# Patient Record
Sex: Female | Born: 1965
Health system: Southern US, Community
[De-identification: ages and names within clinical notes are randomized; demographics above are authoritative.]

## PROBLEM LIST (undated history)

## (undated) DIAGNOSIS — N644 Mastodynia: Secondary | ICD-10-CM

## (undated) DIAGNOSIS — C50919 Malignant neoplasm of unspecified site of unspecified female breast: Secondary | ICD-10-CM

## (undated) DIAGNOSIS — R112 Nausea with vomiting, unspecified: Secondary | ICD-10-CM

## (undated) DIAGNOSIS — I10 Essential (primary) hypertension: Secondary | ICD-10-CM

## (undated) DIAGNOSIS — J449 Chronic obstructive pulmonary disease, unspecified: Secondary | ICD-10-CM

## (undated) DIAGNOSIS — F419 Anxiety disorder, unspecified: Secondary | ICD-10-CM

## (undated) DIAGNOSIS — C649 Malignant neoplasm of unspecified kidney, except renal pelvis: Secondary | ICD-10-CM

## (undated) DIAGNOSIS — C801 Malignant (primary) neoplasm, unspecified: Secondary | ICD-10-CM

## (undated) DIAGNOSIS — M1990 Unspecified osteoarthritis, unspecified site: Secondary | ICD-10-CM

## (undated) DIAGNOSIS — N189 Chronic kidney disease, unspecified: Secondary | ICD-10-CM

## (undated) DIAGNOSIS — Z9889 Other specified postprocedural states: Secondary | ICD-10-CM

## (undated) DIAGNOSIS — R569 Unspecified convulsions: Secondary | ICD-10-CM

## (undated) DIAGNOSIS — M779 Enthesopathy, unspecified: Secondary | ICD-10-CM

## (undated) DIAGNOSIS — K219 Gastro-esophageal reflux disease without esophagitis: Secondary | ICD-10-CM

## (undated) HISTORY — DX: Chronic kidney disease, unspecified: N18.9

## (undated) HISTORY — PX: BREAST SURGERY: SHX581

## (undated) HISTORY — DX: Gastro-esophageal reflux disease without esophagitis: K21.9

## (undated) HISTORY — PX: BUNIONECTOMY: SHX129

## (undated) HISTORY — DX: Essential (primary) hypertension: I10

## (undated) HISTORY — PX: NASAL SINUS SURGERY: SHX719

## (undated) HISTORY — PX: ABDOMINAL HYSTERECTOMY: SHX81

## (undated) HISTORY — PX: OOPHORECTOMY: SHX86

## (undated) HISTORY — DX: Malignant neoplasm of unspecified kidney, except renal pelvis: C64.9

## (undated) HISTORY — DX: Malignant (primary) neoplasm, unspecified: C80.1

## (undated) HISTORY — DX: Malignant neoplasm of unspecified site of unspecified female breast: C50.919

## (undated) HISTORY — DX: Chronic obstructive pulmonary disease, unspecified: J44.9

## (undated) HISTORY — DX: Mastodynia: N64.4

## (undated) HISTORY — PX: FOOT SURGERY: SHX648

## (undated) HISTORY — DX: Unspecified osteoarthritis, unspecified site: M19.90

---
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
## 2004-07-01 ENCOUNTER — Emergency Department (HOSPITAL_COMMUNITY): Admission: EM | Admit: 2004-07-01 | Discharge: 2004-07-01 | Payer: Self-pay | Admitting: Emergency Medicine

## 2008-04-20 ENCOUNTER — Ambulatory Visit (HOSPITAL_COMMUNITY): Admission: RE | Admit: 2008-04-20 | Discharge: 2008-04-20 | Payer: Self-pay | Admitting: General Surgery

## 2008-04-20 ENCOUNTER — Encounter: Admission: RE | Admit: 2008-04-20 | Discharge: 2008-04-20 | Payer: Self-pay | Admitting: General Surgery

## 2008-04-20 ENCOUNTER — Encounter (INDEPENDENT_AMBULATORY_CARE_PROVIDER_SITE_OTHER): Payer: Self-pay | Admitting: General Surgery

## 2008-04-24 ENCOUNTER — Emergency Department (HOSPITAL_COMMUNITY): Admission: EM | Admit: 2008-04-24 | Discharge: 2008-04-24 | Payer: Self-pay | Admitting: Emergency Medicine

## 2008-04-25 ENCOUNTER — Ambulatory Visit: Payer: Self-pay | Admitting: Obstetrics and Gynecology

## 2008-04-25 ENCOUNTER — Ambulatory Visit (HOSPITAL_COMMUNITY): Admission: RE | Admit: 2008-04-25 | Discharge: 2008-04-25 | Payer: Self-pay | Admitting: Obstetrics & Gynecology

## 2008-04-26 ENCOUNTER — Ambulatory Visit: Payer: Self-pay | Admitting: Obstetrics and Gynecology

## 2008-04-26 ENCOUNTER — Inpatient Hospital Stay (HOSPITAL_COMMUNITY): Admission: AD | Admit: 2008-04-26 | Discharge: 2008-05-01 | Payer: Self-pay | Admitting: General Surgery

## 2008-05-14 ENCOUNTER — Encounter: Admission: RE | Admit: 2008-05-14 | Discharge: 2008-05-14 | Payer: Self-pay | Admitting: General Surgery

## 2008-05-31 ENCOUNTER — Encounter (INDEPENDENT_AMBULATORY_CARE_PROVIDER_SITE_OTHER): Payer: Self-pay | Admitting: General Surgery

## 2008-05-31 ENCOUNTER — Ambulatory Visit (HOSPITAL_COMMUNITY): Admission: RE | Admit: 2008-05-31 | Discharge: 2008-05-31 | Payer: Self-pay | Admitting: General Surgery

## 2008-06-07 ENCOUNTER — Ambulatory Visit: Payer: Self-pay | Admitting: Hematology & Oncology

## 2008-06-15 LAB — CBC WITH DIFFERENTIAL (CANCER CENTER ONLY)
BASO%: 0.6 % (ref 0.0–2.0)
EOS%: 3.4 % (ref 0.0–7.0)
LYMPH#: 2 10*3/uL (ref 0.9–3.3)
MCHC: 33.9 g/dL (ref 32.0–36.0)
MONO#: 0.5 10*3/uL (ref 0.1–0.9)
NEUT#: 6 10*3/uL (ref 1.5–6.5)
NEUT%: 67.9 % (ref 39.6–80.0)
Platelets: 281 10*3/uL (ref 145–400)
RDW: 11.5 % (ref 10.5–14.6)
WBC: 8.9 10*3/uL (ref 3.9–10.0)

## 2008-06-15 LAB — COMPREHENSIVE METABOLIC PANEL
ALT: 11 U/L (ref 0–35)
AST: 13 U/L (ref 0–37)
Albumin: 3.8 g/dL (ref 3.5–5.2)
Alkaline Phosphatase: 80 U/L (ref 39–117)
BUN: 12 mg/dL (ref 6–23)
Creatinine, Ser: 0.57 mg/dL (ref 0.40–1.20)
Potassium: 3.9 mEq/L (ref 3.5–5.3)

## 2008-07-18 LAB — CBC WITH DIFFERENTIAL (CANCER CENTER ONLY)
BASO%: 0.9 % (ref 0.0–2.0)
EOS%: 1.5 % (ref 0.0–7.0)
HGB: 13.9 g/dL (ref 11.6–15.9)
LYMPH#: 3.7 10*3/uL — ABNORMAL HIGH (ref 0.9–3.3)
LYMPH%: 28.5 % (ref 14.0–48.0)
MCH: 32.8 pg (ref 26.0–34.0)
MONO%: 5.6 % (ref 0.0–13.0)
NEUT#: 8.2 10*3/uL — ABNORMAL HIGH (ref 1.5–6.5)
NEUT%: 63.5 % (ref 39.6–80.0)
RBC: 4.23 10*6/uL (ref 3.70–5.32)

## 2008-07-18 LAB — COMPREHENSIVE METABOLIC PANEL
BUN: 14 mg/dL (ref 6–23)
CO2: 25 mEq/L (ref 19–32)
Glucose, Bld: 164 mg/dL — ABNORMAL HIGH (ref 70–99)
Sodium: 144 mEq/L (ref 135–145)
Total Bilirubin: 0.2 mg/dL — ABNORMAL LOW (ref 0.3–1.2)
Total Protein: 6.5 g/dL (ref 6.0–8.3)

## 2008-07-18 LAB — LUTEINIZING HORMONE: LH: 3.6 m[IU]/mL

## 2008-07-18 LAB — FOLLICLE STIMULATING HORMONE: FSH: 3.1 m[IU]/mL

## 2008-07-30 LAB — ESTRADIOL, ULTRA SENS: Estradiol, Ultra Sensitive: 161

## 2008-08-15 ENCOUNTER — Ambulatory Visit: Payer: Self-pay | Admitting: Hematology & Oncology

## 2008-09-10 LAB — COMPREHENSIVE METABOLIC PANEL
ALT: 9 U/L (ref 0–35)
AST: 10 U/L (ref 0–37)
Alkaline Phosphatase: 66 U/L (ref 39–117)
CO2: 23 mEq/L (ref 19–32)
Creatinine, Ser: 0.6 mg/dL (ref 0.40–1.20)
Sodium: 141 mEq/L (ref 135–145)
Total Bilirubin: 0.3 mg/dL (ref 0.3–1.2)
Total Protein: 6 g/dL (ref 6.0–8.3)

## 2008-09-10 LAB — CBC WITH DIFFERENTIAL (CANCER CENTER ONLY)
BASO#: 0 10*3/uL (ref 0.0–0.2)
BASO%: 0.5 % (ref 0.0–2.0)
EOS%: 3.3 % (ref 0.0–7.0)
HGB: 13.1 g/dL (ref 11.6–15.9)
LYMPH#: 1.3 10*3/uL (ref 0.9–3.3)
MCHC: 33.4 g/dL (ref 32.0–36.0)
NEUT#: 4.7 10*3/uL (ref 1.5–6.5)

## 2008-10-31 ENCOUNTER — Ambulatory Visit: Payer: Self-pay | Admitting: Hematology & Oncology

## 2008-11-01 LAB — COMPREHENSIVE METABOLIC PANEL
ALT: 12 U/L (ref 0–35)
AST: 16 U/L (ref 0–37)
Albumin: 3.6 g/dL (ref 3.5–5.2)
CO2: 23 mEq/L (ref 19–32)
Calcium: 8.6 mg/dL (ref 8.4–10.5)
Chloride: 106 mEq/L (ref 96–112)
Potassium: 3.8 mEq/L (ref 3.5–5.3)
Total Protein: 6.6 g/dL (ref 6.0–8.3)

## 2008-11-01 LAB — CBC WITH DIFFERENTIAL (CANCER CENTER ONLY)
Eosinophils Absolute: 0.2 10*3/uL (ref 0.0–0.5)
MCH: 32.6 pg (ref 26.0–34.0)
MONO%: 5.7 % (ref 0.0–13.0)
NEUT#: 4.3 10*3/uL (ref 1.5–6.5)
Platelets: 240 10*3/uL (ref 145–400)
RBC: 4 10*6/uL (ref 3.70–5.32)
WBC: 6.7 10*3/uL (ref 3.9–10.0)

## 2008-12-19 ENCOUNTER — Ambulatory Visit: Payer: Self-pay | Admitting: Hematology & Oncology

## 2008-12-20 LAB — CBC WITH DIFFERENTIAL (CANCER CENTER ONLY)
EOS%: 4.4 % (ref 0.0–7.0)
MCH: 32.8 pg (ref 26.0–34.0)
MCHC: 33.4 g/dL (ref 32.0–36.0)
MONO%: 7 % (ref 0.0–13.0)
NEUT#: 5.3 10*3/uL (ref 1.5–6.5)
Platelets: 263 10*3/uL (ref 145–400)
RDW: 11 % (ref 10.5–14.6)

## 2008-12-21 LAB — VITAMIN D 25 HYDROXY (VIT D DEFICIENCY, FRACTURES): Vit D, 25-Hydroxy: 25 ng/mL — ABNORMAL LOW (ref 30–89)

## 2008-12-21 LAB — COMPREHENSIVE METABOLIC PANEL
ALT: 11 U/L (ref 0–35)
BUN: 9 mg/dL (ref 6–23)
CO2: 24 mEq/L (ref 19–32)
Calcium: 8.6 mg/dL (ref 8.4–10.5)
Chloride: 104 mEq/L (ref 96–112)
Creatinine, Ser: 0.71 mg/dL (ref 0.40–1.20)
Total Bilirubin: 0.5 mg/dL (ref 0.3–1.2)

## 2009-01-18 ENCOUNTER — Encounter: Admission: RE | Admit: 2009-01-18 | Discharge: 2009-01-18 | Payer: Self-pay | Admitting: Hematology & Oncology

## 2009-02-20 ENCOUNTER — Ambulatory Visit: Payer: Self-pay | Admitting: Hematology & Oncology

## 2009-04-25 ENCOUNTER — Ambulatory Visit: Payer: Self-pay | Admitting: Hematology & Oncology

## 2009-04-26 LAB — CBC WITH DIFFERENTIAL (CANCER CENTER ONLY)
BASO#: 0 10*3/uL (ref 0.0–0.2)
EOS%: 4.2 % (ref 0.0–7.0)
HCT: 40 % (ref 34.8–46.6)
HGB: 13.6 g/dL (ref 11.6–15.9)
MCH: 33.1 pg (ref 26.0–34.0)
MCHC: 33.9 g/dL (ref 32.0–36.0)
MONO%: 6.7 % (ref 0.0–13.0)
NEUT%: 65.8 % (ref 39.6–80.0)

## 2009-04-27 LAB — COMPREHENSIVE METABOLIC PANEL
ALT: 11 U/L (ref 0–35)
AST: 12 U/L (ref 0–37)
CO2: 25 mEq/L (ref 19–32)
Calcium: 9.1 mg/dL (ref 8.4–10.5)
Chloride: 105 mEq/L (ref 96–112)
Creatinine, Ser: 0.69 mg/dL (ref 0.40–1.20)
Sodium: 141 mEq/L (ref 135–145)
Total Bilirubin: 0.6 mg/dL (ref 0.3–1.2)
Total Protein: 6.7 g/dL (ref 6.0–8.3)

## 2009-04-27 LAB — VITAMIN D 25 HYDROXY (VIT D DEFICIENCY, FRACTURES): Vit D, 25-Hydroxy: 43 ng/mL (ref 30–89)

## 2009-06-07 ENCOUNTER — Encounter: Admission: RE | Admit: 2009-06-07 | Discharge: 2009-06-07 | Payer: Self-pay | Admitting: General Surgery

## 2009-07-20 HISTORY — PX: KIDNEY SURGERY: SHX687

## 2009-10-22 ENCOUNTER — Ambulatory Visit: Payer: Self-pay | Admitting: Hematology & Oncology

## 2009-10-24 LAB — CBC WITH DIFFERENTIAL (CANCER CENTER ONLY)
Eosinophils Absolute: 0.5 10*3/uL (ref 0.0–0.5)
HCT: 43.7 % (ref 34.8–46.6)
LYMPH%: 23.7 % (ref 14.0–48.0)
MCV: 98 fL (ref 81–101)
MONO#: 0.7 10*3/uL (ref 0.1–0.9)
NEUT%: 63.8 % (ref 39.6–80.0)
RBC: 4.48 10*6/uL (ref 3.70–5.32)
WBC: 10.1 10*3/uL — ABNORMAL HIGH (ref 3.9–10.0)

## 2009-11-27 ENCOUNTER — Ambulatory Visit: Payer: Self-pay | Admitting: Hematology & Oncology

## 2009-11-27 ENCOUNTER — Ambulatory Visit (HOSPITAL_BASED_OUTPATIENT_CLINIC_OR_DEPARTMENT_OTHER): Admission: RE | Admit: 2009-11-27 | Discharge: 2009-11-27 | Payer: Self-pay | Admitting: Hematology & Oncology

## 2009-11-27 ENCOUNTER — Ambulatory Visit: Payer: Self-pay | Admitting: Diagnostic Radiology

## 2009-12-05 ENCOUNTER — Encounter: Payer: Self-pay | Admitting: Hematology & Oncology

## 2009-12-05 ENCOUNTER — Ambulatory Visit: Payer: Self-pay | Admitting: Vascular Surgery

## 2009-12-05 ENCOUNTER — Ambulatory Visit: Admission: RE | Admit: 2009-12-05 | Discharge: 2009-12-05 | Payer: Self-pay | Admitting: Hematology & Oncology

## 2009-12-11 ENCOUNTER — Encounter: Admission: RE | Admit: 2009-12-11 | Discharge: 2009-12-11 | Payer: Self-pay | Admitting: Hematology & Oncology

## 2010-02-06 ENCOUNTER — Ambulatory Visit: Payer: Self-pay | Admitting: Hematology & Oncology

## 2010-03-12 ENCOUNTER — Ambulatory Visit: Payer: Self-pay | Admitting: Hematology & Oncology

## 2010-03-14 LAB — COMPREHENSIVE METABOLIC PANEL
Alkaline Phosphatase: 63 U/L (ref 39–117)
BUN: 6 mg/dL (ref 6–23)
CO2: 26 mEq/L (ref 19–32)
Chloride: 105 mEq/L (ref 96–112)
Creatinine, Ser: 0.68 mg/dL (ref 0.40–1.20)
Potassium: 3.6 mEq/L (ref 3.5–5.3)
Sodium: 141 mEq/L (ref 135–145)
Total Bilirubin: 0.4 mg/dL (ref 0.3–1.2)

## 2010-03-14 LAB — CBC WITH DIFFERENTIAL (CANCER CENTER ONLY)
BASO%: 0.4 % (ref 0.0–2.0)
EOS%: 3.3 % (ref 0.0–7.0)
HCT: 38.3 % (ref 34.8–46.6)
LYMPH#: 1.8 10*3/uL (ref 0.9–3.3)
MCHC: 34.2 g/dL (ref 32.0–36.0)
MONO#: 0.4 10*3/uL (ref 0.1–0.9)
NEUT#: 6 10*3/uL (ref 1.5–6.5)
NEUT%: 70.1 % (ref 39.6–80.0)
RDW: 10.7 % (ref 10.5–14.6)
WBC: 8.5 10*3/uL (ref 3.9–10.0)

## 2010-05-27 ENCOUNTER — Ambulatory Visit: Payer: Self-pay | Admitting: Hematology & Oncology

## 2010-07-20 HISTORY — PX: BLADDER SUSPENSION: SHX72

## 2010-07-20 HISTORY — PX: BLADDER SURGERY: SHX569

## 2010-07-20 HISTORY — PX: CARDIAC CATHETERIZATION: SHX172

## 2010-08-10 ENCOUNTER — Encounter: Payer: Self-pay | Admitting: *Deleted

## 2010-09-01 ENCOUNTER — Emergency Department (INDEPENDENT_AMBULATORY_CARE_PROVIDER_SITE_OTHER): Payer: Self-pay

## 2010-09-01 ENCOUNTER — Emergency Department (HOSPITAL_BASED_OUTPATIENT_CLINIC_OR_DEPARTMENT_OTHER)
Admission: EM | Admit: 2010-09-01 | Discharge: 2010-09-01 | Disposition: A | Discharge status: Home / Self Care | Payer: Self-pay | Attending: Emergency Medicine | Admitting: Emergency Medicine

## 2010-09-01 ENCOUNTER — Encounter (HOSPITAL_BASED_OUTPATIENT_CLINIC_OR_DEPARTMENT_OTHER): Payer: Self-pay | Admitting: Hematology & Oncology

## 2010-09-01 DIAGNOSIS — R112 Nausea with vomiting, unspecified: Secondary | ICD-10-CM

## 2010-09-01 DIAGNOSIS — Z853 Personal history of malignant neoplasm of breast: Secondary | ICD-10-CM | POA: Insufficient documentation

## 2010-09-01 DIAGNOSIS — R109 Unspecified abdominal pain: Secondary | ICD-10-CM | POA: Insufficient documentation

## 2010-09-01 DIAGNOSIS — C50419 Malignant neoplasm of upper-outer quadrant of unspecified female breast: Secondary | ICD-10-CM

## 2010-09-01 DIAGNOSIS — N83209 Unspecified ovarian cyst, unspecified side: Secondary | ICD-10-CM | POA: Insufficient documentation

## 2010-09-01 DIAGNOSIS — R1031 Right lower quadrant pain: Secondary | ICD-10-CM | POA: Insufficient documentation

## 2010-09-01 DIAGNOSIS — F17200 Nicotine dependence, unspecified, uncomplicated: Secondary | ICD-10-CM | POA: Insufficient documentation

## 2010-09-01 DIAGNOSIS — Z79899 Other long term (current) drug therapy: Secondary | ICD-10-CM | POA: Insufficient documentation

## 2010-09-01 DIAGNOSIS — C649 Malignant neoplasm of unspecified kidney, except renal pelvis: Secondary | ICD-10-CM

## 2010-09-01 DIAGNOSIS — K5730 Diverticulosis of large intestine without perforation or abscess without bleeding: Secondary | ICD-10-CM

## 2010-09-01 LAB — DIFFERENTIAL
Basophils Absolute: 0 10*3/uL (ref 0.0–0.1)
Basophils Relative: 0 % (ref 0–1)
Lymphocytes Relative: 20 % (ref 12–46)
Lymphs Abs: 1.6 10*3/uL (ref 0.7–4.0)
Monocytes Absolute: 0.6 10*3/uL (ref 0.1–1.0)
Monocytes Relative: 8 % (ref 3–12)

## 2010-09-01 LAB — COMPREHENSIVE METABOLIC PANEL
ALT: 24 U/L (ref 0–35)
AST: 22 U/L (ref 0–37)
Albumin: 3.7 g/dL (ref 3.5–5.2)
CO2: 27 mEq/L (ref 19–32)
Calcium: 8.8 mg/dL (ref 8.4–10.5)
Chloride: 107 mEq/L (ref 96–112)
Glucose, Bld: 134 mg/dL — ABNORMAL HIGH (ref 70–99)
Sodium: 143 mEq/L (ref 135–145)
Total Bilirubin: 0.6 mg/dL (ref 0.3–1.2)
Total Protein: 7.2 g/dL (ref 6.0–8.3)

## 2010-09-01 LAB — CBC
Hemoglobin: 13 g/dL (ref 12.0–15.0)
MCV: 91.6 fL (ref 78.0–100.0)
Platelets: 246 10*3/uL (ref 150–400)

## 2010-09-01 LAB — URINALYSIS, ROUTINE W REFLEX MICROSCOPIC
Ketones, ur: NEGATIVE mg/dL
Leukocytes, UA: NEGATIVE
Nitrite: NEGATIVE
Urine Glucose, Fasting: NEGATIVE mg/dL
Urobilinogen, UA: 0.2 mg/dL (ref 0.0–1.0)

## 2010-09-01 LAB — URINE MICROSCOPIC-ADD ON

## 2010-09-01 MED ORDER — IOHEXOL 300 MG/ML  SOLN
100.0000 mL | Freq: Once | INTRAMUSCULAR | Status: AC | PRN
  Administered 2010-09-01: 100 mL via INTRAVENOUS

## 2010-09-25 ENCOUNTER — Ambulatory Visit: Payer: Self-pay | Admitting: Obstetrics & Gynecology

## 2010-11-07 ENCOUNTER — Other Ambulatory Visit: Payer: Self-pay | Admitting: Hematology & Oncology

## 2010-11-07 ENCOUNTER — Encounter (HOSPITAL_BASED_OUTPATIENT_CLINIC_OR_DEPARTMENT_OTHER): Payer: Self-pay | Admitting: Hematology & Oncology

## 2010-11-07 DIAGNOSIS — C50419 Malignant neoplasm of upper-outer quadrant of unspecified female breast: Secondary | ICD-10-CM

## 2010-11-07 DIAGNOSIS — C649 Malignant neoplasm of unspecified kidney, except renal pelvis: Secondary | ICD-10-CM

## 2010-11-07 LAB — COMPREHENSIVE METABOLIC PANEL
ALT: 12 U/L (ref 0–35)
BUN: 9 mg/dL (ref 6–23)
CO2: 24 mEq/L (ref 19–32)
Creatinine, Ser: 0.7 mg/dL (ref 0.40–1.20)
Glucose, Bld: 83 mg/dL (ref 70–99)
Total Bilirubin: 0.6 mg/dL (ref 0.3–1.2)

## 2010-11-07 LAB — CBC WITH DIFFERENTIAL (CANCER CENTER ONLY)
BASO#: 0 10*3/uL (ref 0.0–0.2)
BASO%: 0.3 % (ref 0.0–2.0)
HCT: 41.7 % (ref 34.8–46.6)
HGB: 14.6 g/dL (ref 11.6–15.9)
LYMPH%: 15.7 % (ref 14.0–48.0)
MCHC: 35 g/dL (ref 32.0–36.0)
MCV: 91 fL (ref 81–101)
MONO#: 0.9 10*3/uL (ref 0.1–0.9)
NEUT%: 73.6 % (ref 39.6–80.0)
RDW: 13.6 % (ref 11.1–15.7)
WBC: 11.7 10*3/uL — ABNORMAL HIGH (ref 3.9–10.0)

## 2010-12-02 NOTE — Group Therapy Note (Signed)
NAME:  [name], [name] NO.:  [id number]   MEDICAL RECORD NO.:  [id number]          PATIENT TYPE:  WOC   LOCATION:  [facility]                   FACILITY:  [facility]   PHYSICIAN:  Argentina Donovan, MD        DATE OF BIRTH:  Mar 06, 1966   DATE OF SERVICE:                                  CLINIC NOTE   This patient is a 45 year old Caucasian female that I saw yesterday with  severe right lower quadrant pain.  She had recently had a breast biopsy  of the left breast and thought she had a big ovarian cyst that she was  told.  We did an ultrasound and CA-125 and she has a small simple cyst  of the left ovary measuring 1.9 x 1.7 with a normal CA-125.  Today, she  does not seem in significant pain; however, she just learnt today that  the she has a malignancy in her left breast and it apparently has  metastasized to the lymph nodes on that side.  She apparently also had a  biopsy of the left breast; however, the cervix incision is indurated and  erythematous.  She has seen her general surgeon this afternoon for  followup and treatment plan.  I told her that this kind of cyst is not  significant and probably will go away.  We are going to follow up with  an ultrasound in 6 weeks and see her back at that point, but  at this  point, I see no need for treatment, and she does not seem to be in  significant pain at this point.           ______________________________  Argentina Donovan, MD     PR/MEDQ  D:  04/26/2008  T:  04/27/2008  Job:  [id number]

## 2010-12-02 NOTE — Group Therapy Note (Signed)
[name], [name]               ACCOUNT NO.:  [id number]   MEDICAL RECORD NO.:  [id number]          PATIENT TYPE:  WOC   LOCATION:  [facility]                   FACILITY:  [facility]   PHYSICIAN:  Sid Falcon, CNM  DATE OF BIRTH:  [birth year]   DATE OF SERVICE:  04/25/2008                                  CLINIC NOTE   The patient reports today with a referral for a right ovarian cyst.  Per  records from [facility], the patient was seen on February 16, 2008  with reports of having an MRI in May 2009 and was told that she had a  huge ovarian cyst on the right.  The patient had an appointment on February 16, 2008 with reports of a large ovarian cyst that was documented on an  MRI in May 2009.  The clinic then ordered a pelvic ultrasound on July  31, in which there was a finding of a normal right ovary and vaginal  cuff with no pelvic fluid or adnexal masses found per these medical  records, with no malignancy found on the right.  The patient presented  to their clinic on that day requesting pain medications.  The patient is  now here to follow up further pelvic pain x3 months.   ALLERGIES:  PENICILLIN, ASPIRIN, SULFA, CECLOR, SEPTRA, DOXYCYCLINE,  MORPHINE.   CURRENT MEDICATIONS:  Prevacid.  Norvasc.   MENSTRUAL HISTORY:  Began cycles at 45 years of age.  History of regular  cycles until hysterectomy in 1995.   OBSTETRICAL HISTORY:  4 pregnancies and 3 living children.  Other  pregnancy was not documented.   GYNECOLOGICAL HISTORY:  Last Pap smear in September 2009 had a schedule  to get a repeat Pap smear at her primary care physician's office, Dr.  Katrinka Blazing, next month where she will receive followup.   FAMILY HISTORY:  Father diabetes.  Father heart disease.  Father MI.  High blood pressure father, mother.  Cancer denied.   PERSONAL MEDICAL HISTORY:  Arthritis, asthma, pneumonia, left breast  cancer with a tumor removal, high blood pressure, hepatitis, emphysema.  She states  she is receiving followup at her primary care [facility] for  these issues.   SOCIAL HISTORY:  Smoker, 1/2 pack a day.  Denies the use of alcohol.   PHYSICAL EXAM:  GENERAL:  The patient is alert and oriented x3.  PELVIC:  Right lower quadrant is tender with palpation.  No rebound  tenderness.  VAGINAL:  No abnormal lesions.  No abnormal discharge.  Cervix seen, no  abnormalities.  Negative cervical motion tenderness.  Adnexa difficult  to palpate secondary to the patient's weight.   ASSESSMENT:  1. Right lower quadrant pelvic pain.  2. History of breast cancer.  3. Asthma.  4. Hypertension.  5. Hepatitis C.  6. Emphysema.   PLAN:  The patient is to receive an ultrasound today to assess for an  abnormal mass or cyst.  Labs:  CA125 drawn and sent.  Prescription for  Percocet total #30.  The patient was seen and assessed by Dr. Okey Dupre who  agrees with plan.  The patient  will follow up tomorrow status post  ultrasound for review of the report and for further plan.      Sid Falcon, CNM     WM/MEDQ  D:  04/25/2008  T:  04/25/2008  Job:  [id number]

## 2010-12-02 NOTE — Op Note (Signed)
[name], [name]               ACCOUNT NO.:  [id number]   MEDICAL RECORD NO.:  [id number]          PATIENT TYPE:  AMB   LOCATION:  SDS                          FACILITY:  [facility]   PHYSICIAN:  Angelia Mould. Derrell Lolling, M.D.DATE OF BIRTH:  October 11, 1965   DATE OF PROCEDURE:  04/20/2008  DATE OF DISCHARGE:  04/20/2008                               OPERATIVE REPORT   PREOPERATIVE DIAGNOSIS:  Ductal carcinoma in situ, left breast.   POSTOPERATIVE DIAGNOSIS:  Ductal carcinoma in situ, left breast.   OPERATION PERFORMED:  Left partial mastectomy with needle localization  and specimen mammogram.   SURGEON:  Angelia Mould. Derrell Lolling, MD   OPERATIVE INDICATION:  This is a 45 year old white female who felt a  little thickening in her left breast.  She had mammograms and  ultrasounds in [facility] and these showed a cluster of  calcification in the left breast in the 12 to 1 o'clock position.  This  was reported as being 3.2 x 2.7 cm.  There was an area of irregularity 5  cm of the nipple at 12 o'clock position, and she was also found to have  a fibroadenoma on the right.  She had a left breast image-guided biopsy  at the [facility] [place], which showed low-grade ductal  carcinoma in situ.  The biopsy of the nodule on the right showed a  benign fibroadenoma.  She was counseled as an outpatient.  Options were  discussed.  She is brought to the operating room for a left partial  mastectomy.   OPERATIVE TECHNIQUE:  The patient underwent needle localization at the  [facility] and she was then sent to [facility].  After examining the x-rays, she was taken to the operating  room where she underwent a general LMA anesthetic.  The left breast was  prepped and draped in sterile fashion.  Intravenous antibiotics were  given.  The patient was identified as the correct patient and correct  procedure.   I observed the localizing wire entering the left breast superiorly  and  it was directed inferior and posteriorly.  A curved elliptical incision  was made excising a narrow ellipse of skin around the wire.  I took the  dissection down into the breast tissue and then went very widely around  this area since it was possibly as biggest 3 cm in size, took a piece of  tissue out that was probably 6 x 8 cm in size.  This was marked with the  6-color margin marker kit.  Specimen mammogram was performed at the  [facility] of [place] and they called back and said that the clips  and the calcifications were the center of the specimen and it looked  good.  This was sent for routine histology.  The wound was irrigated  with saline.  Hemostasis was excellent, achieved with electrocautery.  The breast tissue was closed with interrupted sutures of 3-0 Vicryl and  the skin closed with running subcuticular suture of 4-0  Monocryl and Dermabond.  Clean bandages were placed, and the patient was  taken  to the recovery room in stable condition.  Estimated blood loss  was about 15 mL.  Complications none.  Sponge, needle, and instrument  counts were correct.      Angelia Mould. Derrell Lolling, M.D.  Electronically Signed     HMI/MEDQ  D:  04/20/2008  T:  04/21/2008  Job:  [id number]   cc:   [facility] Dr. Vira Browns  Dr. Lloyd Huger, [facility]

## 2010-12-02 NOTE — Discharge Summary (Signed)
[name], [name] NO.:  [id number]   MEDICAL RECORD NO.:  [id number]          PATIENT TYPE:  INP   LOCATION:  1508                         [facility]ACILITY[facility]   PHYSICIAN:  Juanetta Gosling, MDDATE OF BIRTH:  [birth year]   DATE OF ADMISSION:  04/26/2008  DATE OF DISCHARGE:  05/01/2008                               DISCHARGE SUMMARY   ADMISSION DIAGNOSIS:  Status post left breast lumpectomy with  postoperative infection and left breast pain.   DISCHARGE DIAGNOSIS:  Left breast cellulitis.   PROCEDURES PERFORMED:  1. Drainage of left breast seroma cavity by interventional radiology.  2. Intravenous antibiotics.   HISTORY:  [name] is a 45 year old female who underwent a left  breast lumpectomy by Dr. Manus Gunning for DCIS.  She presented to see him  with red and tender breasts.  Was started on oral antibiotics, which  failed oral antibiotic therapy.  She returned to see me with worsening  tenderness.  On her exam, she had cellulitis that was present.  She was  admitted and placed on IV vancomycin and ciprofloxacin.  She underwent a  left breast seroma cavity drainage without difficulty.  This ended up  being without any growth of bacteria, and her cellulitis cleared with  the IV antibiotics.   On May 01, 2008, she was afebrile.  No growth in her drain fluid.  I  removed her drain at that point and sent her home with followup in about  one week to see me.   DISCHARGE MEDICATIONS:  1. Tylox every 4 hours as needed.  2. Ciprofloxacin 1 twice daily until complete.  3. Xanax 0.25 mg 1 every 8 hours as needed.   ACTIVITY:  No lifting for 2 weeks due to her breast surgery.      Juanetta Gosling, MD  Electronically Signed     MCW/MEDQ  D:  06/13/2008  T:  06/13/2008  Job:  [id number]

## 2010-12-02 NOTE — Op Note (Signed)
[name], [name]               ACCOUNT NO.:  [id number]   MEDICAL RECORD NO.:  [id number]          PATIENT TYPE:  AMB   LOCATION:  SDS                          FACILITY:  [facility]   PHYSICIAN:  Juanetta Gosling, MDDATE OF BIRTH:  [birth year]   DATE OF PROCEDURE:  05/31/2008  DATE OF DISCHARGE:                               OPERATIVE REPORT   PREOPERATIVE DIAGNOSIS:  Left breast cancer.   POSTOPERATIVE DIAGNOSIS:  Left breast cancer.   PROCEDURE PERFORMED:  Left axillary sentinel lymph node biopsy.   SURGEON:  Juanetta Gosling, MD   ASSISTANT:  None.   ANESTHESIA:  General.   FINDINGS:  Radioactive and blue node.  The radioactive node had a count  of 1000.  The bed count was zero.   SPECIMENS:  1. Sentinel node.  2. Nonsentinel node.   DISPOSITION:  To pathology.   ESTIMATED BLOOD LOSS:  Minimal.   COMPLICATIONS:  None.   DRAINS:  None.   DISPOSITION:  To PACU in stable condition.   INDICATIONS:  [name] is a 45 year old female who was evaluated by  Dr. Derrell Lolling on April 12, 2008 after undergoing a mammogram and  ultrasound showed a cluster of calcifications in the left breast.  She  underwent a left breast image-guided biopsy which showed a low-grade  DCIS.  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  She subsequently had an  infection for which she was treated with IV vancomycin due to her  multiple allergies.  She has also got an MRI which shows no other areas  of concerning enhancement in either breast.  She has seen Dr. Melvyn Neth in  [facility] and he agreed to radiate her following the  results of her nodal evaluation.  I counseled her for a left axillary  sentinel lymph node biopsy.   PROCEDURE IN DETAIL:  After informed consent was obtained, the patient  was first injected with 1 mCi of 99-M  technetium filtered sulfur colloid  at 11:30 prior to beginning her operation.  She was then taken to the  operating room and placed under general endotracheal anesthesia without  complication.  Her left breast was then prepped and draped in the  standard sterile surgical fashion including her left arm.  Vancomycin  was administered due to her prior infection.  The NeoProbe was then used  to identify an area that a sentinel node was located in her axilla.  Approximately 3-cm incision was then made due to her body habitus  overlying this area and dissection was carried out down through the  axillary fascia where the NeoProbe and dissection was used to identify a  radioactive node that was also blue.  There were several nodes that  appeared grossly normal in this packet of nodes that were removed in  total together as a specimen.  There was one additional node that I  removed that was present that appeared normal and this was labeled as  nonsentinel node.  The  counts of the radioactive node was 1000.  The bed  count on completion of this was zero.  There were no other blue nodes  visualized and these were then passed off the table as a specimen.  I  tied off the areas of the lymphatic drainage and used several clips on  the feeding lymphatic vessels of these lymph nodes.  Hemostasis was  observed.  There was no drainage noted.  I then closed the axillary  fascia with a 3-0 Vicryl and approximated the subcutaneous tissues with  3-0 Vicryl and closed the skin with 4-0 Monocryl in a subcuticular  fashion.  Steri-Strips and sterile dressing were placed and 10 mL of 4%  Marcaine were infiltrated following this as well.  She tolerated this  well and was transferred to the recovery room in stable condition.      Juanetta Gosling, MD     MCW/MEDQ  D:  05/31/2008  T:  06/01/2008  Job:  [id number]   cc:   Lamona Curl, M.D.  Lloyd Huger, M.D.  [place] Kirby Funk, MD

## 2010-12-02 NOTE — Op Note (Signed)
[name], [name]               ACCOUNT NO.:  [id number]   MEDICAL RECORD NO.:  [id number]          PATIENT TYPE:  AMB   LOCATION:  SDS                          FACILITY:  [facility]   PHYSICIAN:  Juanetta Gosling, MDDATE OF BIRTH:  [birth year]   DATE OF PROCEDURE:  DATE OF DISCHARGE:                               OPERATIVE REPORT   PREOPERATIVE DIAGNOSIS:  Left breast cancer.   POSTOPERATIVE DIAGNOSIS:  Left breast cancer.   PROCEDURE:  Left axillary sentinel lymph node biopsy.   SURGEON:  Troy Sine. Dwain Sarna, MD   ASSISTANT:  None.   ANESTHESIA:  General.   FINDINGS:  Sentinel node and non-sentinel node.   SPECIMENS:  Sentinel node and non-sentinel node to pathology.   ESTIMATED BLOOD LOSS:  Minimal.   DRAINS:  None.   COMPLICATIONS:  None.   DISPOSITION:  To PACU in stable condition.   INDICATION:  This is a 45 year old female who had a mammographic finding  of calcifications, underwent an ultrasound-guided biopsy that showed at  low-grade ductal carcinoma in situ.  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  She had a postoperative infection with left breast cellulitis, treated  with IV vancomycin, which is resolved.  She has been seen by Dr. Melvyn Neth  in [facility] and agreed to plan on radiation therapy after  evaluation of her nodes.  She has undergone an MRI, which showed no  areas of concerning enhancement in either breast.  I had to counsel her  for left axillary sentinel lymph node biopsy.   PROCEDURE:  After informed consent was obtained, the patient was first  administered 1 mCi of technetium sulfur colloid by the radiologist.  She  was then taken to the operating room and placed under general  endotracheal anesthesia after having 1 g of vancomycin administered due  to a prior infection.  Her left breast and arm  were then prepped and  draped in standard, sterile, surgical fashion.  Sequential compression  devices were placed on her lower extremities.  A surgical time-out was  then performed.  The Neoprobe was then used to identify an area that  appeared down the sentinel node.  Methylene blue was infiltrated  intradermally around her nipple prior to beginning as well and massaged  in place for 2 minutes.  A 3-cm incision due to her body habitus was  then made in her axillary crease overlying the area where the node was.  Dissection was carried out down through the axillary fascia and entered.  The Neoprobe and dissection was then used to identify a radioactive and  blue node.  There were several nodes in a small packet, all grossly  appeared normal.  Clips were then placed to clip the draining lymphatics  and this was then removed.  The nodes had a counts of 1000, the bed  count being 0.  There was 1 additional small node present that I have  removed and passed off the table as non-sentinel lymph node.  Upon  completion of this, hemostasis was observed.  There were no other  radioactivity in her axilla.  The axillary fascia was then closed with a  3-0 Vicryl.  The skin was closed with a 4-0 Monocryl in a subcuticular  fashion.  Steri-Strips and a sterile dressing were placed.  A 10 mL of  0.25% Marcaine were infiltrated into her wound also.  She tolerated this  well and was transferred to the recovery room in stable condition.      Juanetta Gosling, MD  Electronically Signed     MCW/MEDQ  D:  05/31/2008  T:  06/01/2008  Job:  [id number]   cc:   Weston Settle, MD  Caffie Damme, M.D.

## 2011-03-06 ENCOUNTER — Other Ambulatory Visit: Payer: Self-pay | Admitting: Hematology & Oncology

## 2011-03-06 ENCOUNTER — Encounter (HOSPITAL_BASED_OUTPATIENT_CLINIC_OR_DEPARTMENT_OTHER): Payer: Self-pay | Admitting: Hematology & Oncology

## 2011-03-06 DIAGNOSIS — C50419 Malignant neoplasm of upper-outer quadrant of unspecified female breast: Secondary | ICD-10-CM

## 2011-03-06 DIAGNOSIS — C649 Malignant neoplasm of unspecified kidney, except renal pelvis: Secondary | ICD-10-CM

## 2011-03-06 DIAGNOSIS — E559 Vitamin D deficiency, unspecified: Secondary | ICD-10-CM

## 2011-03-06 LAB — CBC WITH DIFFERENTIAL (CANCER CENTER ONLY)
BASO#: 0 10*3/uL (ref 0.0–0.2)
Eosinophils Absolute: 0.2 10*3/uL (ref 0.0–0.5)
HCT: 38.6 % (ref 34.8–46.6)
HGB: 14.1 g/dL (ref 11.6–15.9)
LYMPH#: 1.8 10*3/uL (ref 0.9–3.3)
LYMPH%: 18.1 % (ref 14.0–48.0)
MCV: 90 fL (ref 81–101)
MONO#: 0.7 10*3/uL (ref 0.1–0.9)
NEUT%: 72.8 % (ref 39.6–80.0)
RBC: 4.3 10*6/uL (ref 3.70–5.32)
WBC: 9.8 10*3/uL (ref 3.9–10.0)

## 2011-03-07 LAB — COMPREHENSIVE METABOLIC PANEL
CO2: 23 mEq/L (ref 19–32)
Calcium: 9.1 mg/dL (ref 8.4–10.5)
Chloride: 103 mEq/L (ref 96–112)
Creatinine, Ser: 0.7 mg/dL (ref 0.50–1.10)
Glucose, Bld: 96 mg/dL (ref 70–99)
Total Bilirubin: 0.3 mg/dL (ref 0.3–1.2)

## 2011-03-07 LAB — VITAMIN D 25 HYDROXY (VIT D DEFICIENCY, FRACTURES): Vit D, 25-Hydroxy: 42 ng/mL (ref 30–89)

## 2011-04-17 DIAGNOSIS — Z8719 Personal history of other diseases of the digestive system: Secondary | ICD-10-CM | POA: Insufficient documentation

## 2011-04-17 DIAGNOSIS — E669 Obesity, unspecified: Secondary | ICD-10-CM | POA: Insufficient documentation

## 2011-04-17 DIAGNOSIS — G4733 Obstructive sleep apnea (adult) (pediatric): Secondary | ICD-10-CM | POA: Insufficient documentation

## 2011-04-17 DIAGNOSIS — Z720 Tobacco use: Secondary | ICD-10-CM | POA: Insufficient documentation

## 2011-04-17 DIAGNOSIS — IMO0002 Reserved for concepts with insufficient information to code with codable children: Secondary | ICD-10-CM | POA: Insufficient documentation

## 2011-04-17 DIAGNOSIS — J449 Chronic obstructive pulmonary disease, unspecified: Secondary | ICD-10-CM | POA: Insufficient documentation

## 2011-04-17 DIAGNOSIS — G8929 Other chronic pain: Secondary | ICD-10-CM | POA: Insufficient documentation

## 2011-04-17 DIAGNOSIS — Z8679 Personal history of other diseases of the circulatory system: Secondary | ICD-10-CM | POA: Insufficient documentation

## 2011-04-21 LAB — URINALYSIS, ROUTINE W REFLEX MICROSCOPIC
Glucose, UA: NEGATIVE
Hgb urine dipstick: NEGATIVE
Specific Gravity, Urine: 1.031 — ABNORMAL HIGH
pH: 6

## 2011-04-21 LAB — COMPREHENSIVE METABOLIC PANEL
ALT: 15
AST: 18
Alkaline Phosphatase: 85
CO2: 28
Calcium: 8.4
GFR calc Af Amer: >60
GFR calc non Af Amer: >60
Potassium: 3.6
Sodium: 136
Total Protein: 6.5

## 2011-04-21 LAB — BASIC METABOLIC PANEL
BUN: 5 — ABNORMAL LOW
BUN: 6
CO2: 30
Calcium: 8.5
Chloride: 106
Creatinine, Ser: 0.7
Creatinine, Ser: 0.76
GFR calc Af Amer: >60
Glucose, Bld: 104 — ABNORMAL HIGH
Potassium: 4.7

## 2011-04-21 LAB — DIFFERENTIAL
Eosinophils Relative: 3
Lymphocytes Relative: 30
Lymphs Abs: 2.1
Monocytes Absolute: 0.5
Monocytes Relative: 8

## 2011-04-21 LAB — BODY FLUID CULTURE

## 2011-04-21 LAB — CBC
HCT: 39.4
MCHC: 33.7
MCHC: 34
MCHC: 33.9
MCV: 98
MCV: 97.8
Platelets: 255
Platelets: 238
RBC: 4.19
RBC: 3.83 — ABNORMAL LOW
RDW: 13.8
WBC: 8
WBC: 8.4

## 2011-04-21 LAB — URINE MICROSCOPIC-ADD ON

## 2011-04-21 LAB — CANCER ANTIGEN 27.29: CA 27.29: 19

## 2011-06-02 ENCOUNTER — Encounter: Payer: Self-pay | Admitting: *Deleted

## 2011-06-03 ENCOUNTER — Other Ambulatory Visit: Payer: Self-pay | Admitting: *Deleted

## 2011-06-03 NOTE — Telephone Encounter (Signed)
Pt called yesterday requesting refills for Xanax and Endocet. Left message this morning stating the prescriptions were ready and she could come pick them up.

## 2011-06-09 ENCOUNTER — Telehealth (INDEPENDENT_AMBULATORY_CARE_PROVIDER_SITE_OTHER): Payer: Self-pay | Admitting: General Surgery

## 2011-06-09 NOTE — Telephone Encounter (Signed)
Having left nipple drainage and soreness. History of breast cancer. Has been going on for a month and a half. Patient was hoping to be seen on 06/16/11 when her mother has an appt. Dr Dwain Sarna doesn't have available time. Alisha to call her back after talking with Dr Dwain Sarna. To call [phone] or mother's number [phone].

## 2011-07-02 ENCOUNTER — Encounter (INDEPENDENT_AMBULATORY_CARE_PROVIDER_SITE_OTHER): Payer: Self-pay | Admitting: General Surgery

## 2011-07-02 ENCOUNTER — Ambulatory Visit (INDEPENDENT_AMBULATORY_CARE_PROVIDER_SITE_OTHER): Payer: Self-pay | Admitting: General Surgery

## 2011-07-02 VITALS — BP 158/100 | HR 64 | Temp 97.9°F | Resp 24 | Ht 64.0 in | Wt 245.1 lb

## 2011-07-02 DIAGNOSIS — Z853 Personal history of malignant neoplasm of breast: Secondary | ICD-10-CM

## 2011-07-02 DIAGNOSIS — N6459 Other signs and symptoms in breast: Secondary | ICD-10-CM

## 2011-07-02 DIAGNOSIS — N6452 Nipple discharge: Secondary | ICD-10-CM

## 2011-07-02 NOTE — Progress Notes (Signed)
Subjective:     Patient ID: [name], female   DOB: [birth year], 45 y.o.   MRN: [id number]  HPI This is a 45 year old female who underwent a left breast lumpectomy by one of my partners and then this was followed by sentinel node biopsy by me. 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 She completed radiation therapy after that as well. The last time I saw her was in 2010 and she was recommended a 6 month followup mammogram which I cannot find it she states that she did not remember having. She has done fairly well except for the fact that she's had some continued left breast soreness and now she's had some white spontaneous discharge from only her left nipple for the last year. She also reports if she feels what she thinks are 2 masses in her left breast in the upper inner quadrant. She comes in today to discuss treatment of these. 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 Her mother has also recently been diagnosed with breast cancer and is being taken care of by one of my partners.  Review of Systems  Constitutional: Negative for fever, chills and unexpected weight change.  HENT: Negative for hearing loss, congestion, sore throat, trouble swallowing and voice change.   Eyes: Negative for visual disturbance.  Respiratory: Positive for cough and wheezing.   Cardiovascular: Positive for palpitations. Negative for chest pain and leg swelling.  Gastrointestinal: Negative for nausea, vomiting, abdominal pain, diarrhea, constipation, blood in stool, abdominal distention and anal bleeding.  Genitourinary: Negative for hematuria, vaginal bleeding and difficulty urinating.  Musculoskeletal: Negative for arthralgias.  Skin: Negative for rash and wound.  Neurological: Positive for headaches. Negative for seizures and syncope.  Hematological: Negative for adenopathy. Does not bruise/bleed easily.  Psychiatric/Behavioral: Negative  for confusion.       Objective:   Physical Exam  Constitutional: She appears well-developed and well-nourished.  Pulmonary/Chest: Right breast exhibits no inverted nipple, no mass, no nipple discharge, no skin change and no tenderness. Left breast exhibits mass (2 areas in left upper inner quadrant may be masses vs scar tissue vs fat necrosis) and tenderness (throughout left breast). Left breast exhibits no inverted nipple and no nipple discharge (I cannot express today). Breasts are asymmetrical (right breast larger than left).    Lymphadenopathy:    She has no cervical adenopathy.    She has axillary adenopathy.       Right axillary: No pectoral and no lateral adenopathy present.       Left axillary: Lateral (? adenopathy vs scar tissue) adenopathy present. No pectoral adenopathy present.      Right: No supraclavicular adenopathy present.       Left: No supraclavicular adenopathy present.       Assessment:     History stage I left breast cancer Possible left breast mass Nipple discharge    Plan:     She does have areas on exam of  her breasts that I am not  sure what these are as well as in her left axilla. They are also associated with nipple discharge. I would send her to get her bilateral mammogram as well as an ultrasound of the upper quadrant of her left breast in her axilla. Following this I will have her return to discuss the plan.

## 2011-07-03 ENCOUNTER — Ambulatory Visit (HOSPITAL_BASED_OUTPATIENT_CLINIC_OR_DEPARTMENT_OTHER): Payer: Self-pay | Admitting: Hematology & Oncology

## 2011-07-03 ENCOUNTER — Other Ambulatory Visit: Payer: Self-pay | Admitting: Hematology & Oncology

## 2011-07-03 ENCOUNTER — Encounter: Payer: Self-pay | Admitting: Hematology & Oncology

## 2011-07-03 ENCOUNTER — Other Ambulatory Visit (HOSPITAL_BASED_OUTPATIENT_CLINIC_OR_DEPARTMENT_OTHER): Payer: Self-pay | Admitting: Lab

## 2011-07-03 DIAGNOSIS — F419 Anxiety disorder, unspecified: Secondary | ICD-10-CM

## 2011-07-03 DIAGNOSIS — F329 Major depressive disorder, single episode, unspecified: Secondary | ICD-10-CM

## 2011-07-03 DIAGNOSIS — C50919 Malignant neoplasm of unspecified site of unspecified female breast: Secondary | ICD-10-CM

## 2011-07-03 DIAGNOSIS — C649 Malignant neoplasm of unspecified kidney, except renal pelvis: Secondary | ICD-10-CM | POA: Insufficient documentation

## 2011-07-03 DIAGNOSIS — N644 Mastodynia: Secondary | ICD-10-CM

## 2011-07-03 HISTORY — DX: Mastodynia: N64.4

## 2011-07-03 HISTORY — DX: Malignant neoplasm of unspecified site of unspecified female breast: C50.919

## 2011-07-03 HISTORY — DX: Malignant neoplasm of unspecified kidney, except renal pelvis: C64.9

## 2011-07-03 LAB — CBC WITH DIFFERENTIAL (CANCER CENTER ONLY)
BASO%: 0.2 % (ref 0.0–2.0)
EOS%: 1.9 % (ref 0.0–7.0)
LYMPH#: 2.3 10*3/uL (ref 0.9–3.3)
MCHC: 34.7 g/dL (ref 32.0–36.0)
MONO#: 0.9 10*3/uL (ref 0.1–0.9)
NEUT#: 7.8 10*3/uL — ABNORMAL HIGH (ref 1.5–6.5)
NEUT%: 70.1 % (ref 39.6–80.0)
Platelets: 253 10*3/uL (ref 145–400)
RDW: 13.6 % (ref 11.1–15.7)
WBC: 11.2 10*3/uL — ABNORMAL HIGH (ref 3.9–10.0)

## 2011-07-03 MED ORDER — ALPRAZOLAM 1 MG PO TABS: 1.0000 mg | ORAL_TABLET | Freq: Three times a day (TID) | ORAL | Status: DC | PRN

## 2011-07-03 MED ORDER — ALPRAZOLAM ER 3 MG PO TB24: 3.0000 mg | ORAL_TABLET | Freq: Two times a day (BID) | ORAL | Status: DC

## 2011-07-03 MED ORDER — OXYCODONE-ACETAMINOPHEN 10-325 MG PO TABS: 1.0000 | ORAL_TABLET | ORAL | Status: DC | PRN

## 2011-07-03 NOTE — Progress Notes (Signed)
This office note has been dictated.

## 2011-07-03 NOTE — Progress Notes (Signed)
CC:   Carolyn Gosling, MD  DIAGNOSES: 1. 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 2. 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 3. Chronic breast pain of the left breast. 4. Nipple discharge of the left breast.  INTERIM HISTORY:  [name] comes in for followup.  She saw Dr. Dwain Sarna yesterday.  He is going to do a mammogram and ultrasound on her left breast.  He is a little worried about a couple of spots on her left breast.  It sounds like she may need to have surgery.  She says she just wants to have a mastectomy to "get rid of the problem."  She has not had any bladder issues, which is a blessing.  She still has chronic pain issues with the left breast.  She is still dealing with issues at home with her family.  She is going out of town this weekend to try to get away from all the problems that her family is causing her.  She is still smoking.  She did get a lot of her hair cut off and donated this.  PHYSICAL EXAM:  General:  This is a well-developed well-nourished white female in no obvious distress.  Vital Signs:  Temperature 98, pulse 60, respiratory rate 14, blood pressure 120/78.  Weight is 252.  Head/Neck: Exam shows a normocephalic, atraumatic skull.  There are no ocular or oral lesions.  No palpable cervical or supraclavicular lymph nodes. Lungs:  Clear bilaterally.  Cardiac:  Regular rate and rhythm with a normal S1, S2.  There are no murmurs, rubs or bruits.  Breasts:  Exam shows the right breast with no masses, edema or erythema.  There is no right axillary adenopathy.  There is no right nipple discharge.  The left breast does show tenderness throughout the breast.  There is some fullness in the inner aspect of the left breast.  She does have some possible cystic changes.  Her lumpectomy was at the 1 o'clock position. This is slightly tender and firm.  There is no nipple discharge today. There is no left axillary adenopathy.   Abdomen:  Soft with good bowel sounds.  She had a laparotomy wound on the right side for her nephrectomy.  This is nontender.  There is no palpable abdominal mass. No palpable hepatosplenomegaly.  Extremities:  No clubbing, cyanosis or edema.  Neurologic:  Exam shows no focal neurological deficits.  LABORATORY STUDIES:  White cell count 11.2, hemoglobin 14.2, hematocrit 40.9, platelet count 253.  IMPRESSION:  [name] is a 45 year old white female with stage IA ductal carcinoma of the left breast.  She had a low oncotype score.  She is on Fareston.  It has been about 3 years since her diagnosis.  Again, I do not see a problem with her having recurrence.  I suppose that she may have a 2nd primary breast cancer, which again would be unusual.  Unfortunately, her mother was recently  diagnosis with breast cancer. She is going to have surgery, I think, up in [place] next week.  She will let me know if we need to see her down here or not.  I will not make an appointment with Ms. Lantigua in another 3 months.  I have to believe that whatever is going with the left breast is not malignant.  We did go ahead and refill her prescriptions today.  She is very diligent with these, and they do help her out with  allowing her to have a quality of life and trying to cope with all the issues with her family.    ______________________________ Josph Macho, M.D. PRE/MEDQ  D:  07/03/2011  T:  07/03/2011  Job:  [id number]

## 2011-07-03 NOTE — Progress Notes (Signed)
Addended by: Alyson Locket N on: 07/03/2011 09:00 AM   Modules accepted: Orders

## 2011-07-04 LAB — COMPREHENSIVE METABOLIC PANEL
ALT: 13 U/L (ref 0–35)
AST: 18 U/L (ref 0–37)
CO2: 25 mEq/L (ref 19–32)
Calcium: 9.4 mg/dL (ref 8.4–10.5)
Chloride: 103 mEq/L (ref 96–112)
Creatinine, Ser: 0.59 mg/dL (ref 0.50–1.10)
Potassium: 3.9 mEq/L (ref 3.5–5.3)
Sodium: 139 mEq/L (ref 135–145)
Total Protein: 6.2 g/dL (ref 6.0–8.3)

## 2011-07-17 ENCOUNTER — Inpatient Hospital Stay: Admission: RE | Admit: 2011-07-17 | Payer: Self-pay | Source: Ambulatory Visit

## 2011-07-29 ENCOUNTER — Ambulatory Visit
Admission: RE | Admit: 2011-07-29 | Discharge: 2011-07-29 | Disposition: A | Discharge status: Home / Self Care | Payer: Self-pay | Source: Ambulatory Visit | Attending: General Surgery | Admitting: General Surgery

## 2011-07-29 DIAGNOSIS — Z853 Personal history of malignant neoplasm of breast: Secondary | ICD-10-CM

## 2011-07-29 DIAGNOSIS — N6452 Nipple discharge: Secondary | ICD-10-CM

## 2011-07-31 ENCOUNTER — Other Ambulatory Visit: Payer: Self-pay | Admitting: *Deleted

## 2011-07-31 DIAGNOSIS — F329 Major depressive disorder, single episode, unspecified: Secondary | ICD-10-CM

## 2011-07-31 DIAGNOSIS — C50919 Malignant neoplasm of unspecified site of unspecified female breast: Secondary | ICD-10-CM

## 2011-07-31 DIAGNOSIS — N644 Mastodynia: Secondary | ICD-10-CM

## 2011-07-31 DIAGNOSIS — C649 Malignant neoplasm of unspecified kidney, except renal pelvis: Secondary | ICD-10-CM

## 2011-07-31 DIAGNOSIS — F32A Depression, unspecified: Secondary | ICD-10-CM

## 2011-07-31 MED ORDER — OXYCODONE-ACETAMINOPHEN 10-325 MG PO TABS: 1.0000 | ORAL_TABLET | ORAL | Status: DC | PRN

## 2011-07-31 MED ORDER — ALPRAZOLAM 1 MG PO TABS: 1.0000 mg | ORAL_TABLET | Freq: Three times a day (TID) | ORAL | Status: DC | PRN

## 2011-07-31 NOTE — Telephone Encounter (Signed)
Pt called on 07-29-11 stating she needed a refill on her Endocet and Xanax. Wants to pick up today. Will route to Dr Myna Hidalgo to sign.

## 2011-08-12 ENCOUNTER — Telehealth: Payer: Self-pay | Admitting: *Deleted

## 2011-08-12 NOTE — Telephone Encounter (Signed)
Returned pt's call from earlier today. She left a message at 1135 stating she needed to come in to see Dr Myna Hidalgo because she has "knots" in her breast. She "had a mammogram that was negative". Reviewed with Dr Myna Hidalgo. She needs to see her surgeon for evaluation. Explained Dr. Gustavo Lah recommendation to her and she stated that the area where she had the ultrasound and biopsy done are sore and feel like they are filling up with fluid. Told [name] that she needs to let Dr Dwain Sarna know. She verbalized understanding and stated that she wanted to check with Dr Myna Hidalgo 1st to see what to do.

## 2011-08-17 ENCOUNTER — Other Ambulatory Visit: Payer: Self-pay | Admitting: *Deleted

## 2011-08-17 ENCOUNTER — Other Ambulatory Visit: Payer: Self-pay | Admitting: Hematology & Oncology

## 2011-08-17 ENCOUNTER — Encounter: Payer: Self-pay | Admitting: *Deleted

## 2011-08-17 DIAGNOSIS — F329 Major depressive disorder, single episode, unspecified: Secondary | ICD-10-CM

## 2011-08-17 DIAGNOSIS — C50919 Malignant neoplasm of unspecified site of unspecified female breast: Secondary | ICD-10-CM

## 2011-08-17 DIAGNOSIS — N644 Mastodynia: Secondary | ICD-10-CM

## 2011-08-17 DIAGNOSIS — C649 Malignant neoplasm of unspecified kidney, except renal pelvis: Secondary | ICD-10-CM

## 2011-08-17 MED ORDER — OXYCODONE-ACETAMINOPHEN 10-325 MG PO TABS: 1.0000 | ORAL_TABLET | Freq: Three times a day (TID) | ORAL | Status: DC | PRN

## 2011-08-17 NOTE — Telephone Encounter (Signed)
Pt left message on voicemail stating that Dr Myna Hidalgo changed the dosage of her pain medication and because of that she is out. Stated she will stop by before 5 pm today to pick it up. Reviewed the pt's chart. Her last rx was provided to her on 07/31/11. She has been getting monthly refills. Reviewed with Dr Myna Hidalgo. He stated that that has not changed. Her Percocet 10/325mg  should last her for 30 days. Attempted to reach her at her home # that she was reached on the 23rd but it was not in service and the cell # that was left stated it was not accepting calls per the request of the user. Attempted to use another # in case it was written down wrong. Left a generic message stating "if this is Shadell's phone, please call Dr Gustavo Lah office at [phone]".

## 2011-08-18 NOTE — Progress Notes (Unsigned)
Pt left a message on the voicemail on 08/07/11 at 0846 stating Dr. Myna Hidalgo changed the dosage of her pain medication therefore she is out of pills and will pick up a new one by 5 pm.

## 2011-08-27 ENCOUNTER — Encounter: Payer: Self-pay | Admitting: *Deleted

## 2011-08-27 NOTE — Progress Notes (Signed)
Pt's records since 8/12 faxed to Disability Determination services in [facility] as requested.

## 2011-08-31 ENCOUNTER — Other Ambulatory Visit: Payer: Self-pay | Admitting: *Deleted

## 2011-08-31 DIAGNOSIS — F329 Major depressive disorder, single episode, unspecified: Secondary | ICD-10-CM

## 2011-08-31 MED ORDER — ALPRAZOLAM 1 MG PO TABS: 1.0000 mg | ORAL_TABLET | Freq: Three times a day (TID) | ORAL | Status: DC | PRN

## 2011-08-31 NOTE — Telephone Encounter (Signed)
Pt left message on voicemail stating she was coming in this morning to pick up a rx for Xanax and to call her back if it was going to be a problem at [phone]. Will route rx to Dr Myna Hidalgo to approve then sign.

## 2011-09-14 ENCOUNTER — Ambulatory Visit (INDEPENDENT_AMBULATORY_CARE_PROVIDER_SITE_OTHER): Payer: Self-pay | Admitting: General Surgery

## 2011-09-14 ENCOUNTER — Encounter (INDEPENDENT_AMBULATORY_CARE_PROVIDER_SITE_OTHER): Payer: Self-pay | Admitting: General Surgery

## 2011-09-14 VITALS — BP 142/96 | HR 68 | Temp 97.3°F | Resp 20 | Ht 64.0 in | Wt 246.4 lb

## 2011-09-14 DIAGNOSIS — Z853 Personal history of malignant neoplasm of breast: Secondary | ICD-10-CM

## 2011-09-14 NOTE — Progress Notes (Signed)
Subjective:     Patient ID: [name], female   DOB: 02-26-1966, 46 y.o.   MRN: [id number]  HPI This is a 46 yo female is history is documented in my last note. She has some concerns about some masses in her left breast. I saw her last time and send her to get a mammogram as well as ultrasound. The main complaint she has is tenderness in these areas. Her mammogram shows some benign skin calcifications corresponded to the palpable finding as well as some coarse curvilinear calcifications consistent with some fat necrosis in the axilla. There also was a benign course calcifications present in the lumpectomy site consistent with fat necrosis. Ultrasound showed in the area near the sternal border showing fatty tissue likely consistent with some fat necrosis by my exam. There no abnormal nodes and no other abnormalities on her ultrasound. She returns today to discuss these findings and a possible treatment.  Review of Systems     Objective:   Physical Exam  Pulmonary/Chest: Left breast exhibits tenderness. Left breast exhibits no inverted nipple, no nipple discharge and no skin change.         Assessment:     History of breast cancer    Plan:     I had a  long discussion with her today. I think that these areas are most consistent with fat necrosis. They feel that way by exam as well as by her ultrasound. We discussed biopsy. She is not excited to do that at this point. I think it would be reasonable to close followup of these and  should resolve at some point if it is fat necrosis. I asked her if these changes at all to please call me back but I think that these will likely does get better on her own. If they persist or change in any way and will proceed with a biopsy.

## 2011-09-15 ENCOUNTER — Other Ambulatory Visit: Payer: Self-pay | Admitting: *Deleted

## 2011-09-15 DIAGNOSIS — N644 Mastodynia: Secondary | ICD-10-CM

## 2011-09-15 DIAGNOSIS — C50919 Malignant neoplasm of unspecified site of unspecified female breast: Secondary | ICD-10-CM

## 2011-09-15 MED ORDER — OXYCODONE-ACETAMINOPHEN 10-325 MG PO TABS: 1.0000 | ORAL_TABLET | Freq: Three times a day (TID) | ORAL | Status: DC | PRN

## 2011-09-15 NOTE — Telephone Encounter (Signed)
Pt left message this morning at 0836 stating she was coming in to pick up her pain medication today and to call her at [phone] if anyone needed to speak with her. Rx request forwarded to Dr Myna Hidalgo for approval.

## 2011-09-25 ENCOUNTER — Telehealth: Payer: Self-pay | Admitting: Hematology & Oncology

## 2011-09-25 ENCOUNTER — Ambulatory Visit (HOSPITAL_BASED_OUTPATIENT_CLINIC_OR_DEPARTMENT_OTHER): Payer: Self-pay | Admitting: Hematology & Oncology

## 2011-09-25 ENCOUNTER — Other Ambulatory Visit: Payer: Self-pay | Admitting: Lab

## 2011-09-25 ENCOUNTER — Other Ambulatory Visit (HOSPITAL_BASED_OUTPATIENT_CLINIC_OR_DEPARTMENT_OTHER): Payer: Self-pay | Admitting: Lab

## 2011-09-25 ENCOUNTER — Ambulatory Visit: Payer: Self-pay | Admitting: Hematology & Oncology

## 2011-09-25 VITALS — BP 135/81 | HR 71 | Temp 97.9°F | Ht 64.0 in | Wt 248.0 lb

## 2011-09-25 DIAGNOSIS — C50919 Malignant neoplasm of unspecified site of unspecified female breast: Secondary | ICD-10-CM

## 2011-09-25 DIAGNOSIS — N644 Mastodynia: Secondary | ICD-10-CM

## 2011-09-25 DIAGNOSIS — C7900 Secondary malignant neoplasm of unspecified kidney and renal pelvis: Secondary | ICD-10-CM

## 2011-09-25 DIAGNOSIS — C50419 Malignant neoplasm of upper-outer quadrant of unspecified female breast: Secondary | ICD-10-CM

## 2011-09-25 DIAGNOSIS — F329 Major depressive disorder, single episode, unspecified: Secondary | ICD-10-CM

## 2011-09-25 DIAGNOSIS — M810 Age-related osteoporosis without current pathological fracture: Secondary | ICD-10-CM

## 2011-09-25 DIAGNOSIS — F419 Anxiety disorder, unspecified: Secondary | ICD-10-CM

## 2011-09-25 DIAGNOSIS — C649 Malignant neoplasm of unspecified kidney, except renal pelvis: Secondary | ICD-10-CM

## 2011-09-25 LAB — CBC WITH DIFFERENTIAL (CANCER CENTER ONLY)
BASO#: 0 10*3/uL (ref 0.0–0.2)
BASO%: 0.1 % (ref 0.0–2.0)
EOS%: 1.9 % (ref 0.0–7.0)
HGB: 15.3 g/dL (ref 11.6–15.9)
LYMPH#: 1.9 10*3/uL (ref 0.9–3.3)
MCHC: 34.6 g/dL (ref 32.0–36.0)
MONO%: 6.7 % (ref 0.0–13.0)
NEUT#: 9.1 10*3/uL — ABNORMAL HIGH (ref 1.5–6.5)
Platelets: 262 10*3/uL (ref 145–400)

## 2011-09-25 LAB — COMPREHENSIVE METABOLIC PANEL
ALT: 13 U/L (ref 0–35)
AST: 15 U/L (ref 0–37)
Albumin: 4.1 g/dL (ref 3.5–5.2)
Alkaline Phosphatase: 79 U/L (ref 39–117)
BUN: 15 mg/dL (ref 6–23)
Calcium: 9.2 mg/dL (ref 8.4–10.5)
Chloride: 104 mEq/L (ref 96–112)
Potassium: 4.4 mEq/L (ref 3.5–5.3)
Sodium: 139 mEq/L (ref 135–145)
Total Protein: 6.9 g/dL (ref 6.0–8.3)

## 2011-09-25 MED ORDER — ALPRAZOLAM 1 MG PO TABS: 1.0000 mg | ORAL_TABLET | Freq: Three times a day (TID) | ORAL | Status: DC | PRN

## 2011-09-25 NOTE — Progress Notes (Signed)
This office note has been dictated.

## 2011-09-25 NOTE — Telephone Encounter (Signed)
Mailed 6-14 schedule

## 2011-09-25 NOTE — Progress Notes (Signed)
DIAGNOSES: 1. 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 2. 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 3. Chronic breast pain secondary to likely fat     necrosis/surgery/radiation.  CURRENT THERAPY:  Fareston 60 mg p.o. daily.  INTERIM HISTORY:  Ms. Starrett comes in for followup.  She has been seeing Dr. Dwain Sarna.  He is doing his best to try to help with this chronic pain issue that she has with her left breast.  He feels that she has fat necrosis in the left breast.  He does not feel that there was any underlying malignancy.  Ms. Kaczmarek is tired of having the breast hurt as it is.  She would really prefer to get a mastectomy.  This would, in her mind, make her life a whole lot more livable.  Of note, her dog just had 14 puppies last night.  She has a son who has twins that he will not really take care of.  She has a son who is in jail for drug use.  As such, Ms. Covington is really "at her wits end" trying to deal with all these issues.  She has really kept a good demeanor.  She, herself, has been through a lot of issues.  She has the significant left breast pain.  This is chronic.  There is nothing that we can do for her about this.  She really wants to have a mastectomy.  She is just tired of trying to have biopsies and surgeries.  She says that she "doesn't need these" and, hopefully, she can get this done.  She sees Dr. Dwain Sarna, I think, in another couple months or so.  She is not having bladder issues, which is a blessing, right now.  She is seen at [facility] for bladder prolapse, I think, issues.  PHYSICAL EXAMINATION:  General Appearance:  This is an obese, white female in no obvious distress.  Vital Signs:  Temperature 97.9.  Pulse 71.  Respiratory rate 18.  Blood pressure 135/81.  Weight is 248.  Head and Neck Exam:  A normocephalic, atraumatic skull.  There are no ocular or oral lesions.  There is no mucositis.  There is no  adenopathy in her neck.  Lungs:  Clear bilaterally.  She has had some scattered crackles. Cardiac Exam:  Regular rhythm with a normal S1 and S2.  There are no murmurs, rubs, or bruits.  Abdominal Exam:  Soft with good bowel sounds. There is no palpable abdominal mass.  There is no fluid wave.  There is no palpable hepatosplenomegaly.  Breast exam:  The right breast is with no masses, edema, or erythema.  There is no right axillary adenopathy. The left breast shows the lumpectomy at about the 1 o'clock position. She has areas that are quite tender.  These were located at the, I think, 9 o'clock position.  She has no left axillary adenopathy.  Back Exam:  No tenderness over the spine, ribs, or hips.  Extremities:  No lymphedema in the left arm.  She has good range of motion of her joints. Neurological Exam:  No focal neurological deficits.  LABORATORY STUDIES:  White cell count is 12, hemoglobin 15.3, hematocrit 44.2, platelet count is 262.  IMPRESSION:  Ms. Bergsma is a 46 year old, white female with stage I ductal carcinoma of the left breast.  This was her first cancer.  She does not require systemic chemotherapy.  She had a very low Oncotype  score.  Fareston, which we can get samples for her, is what she has been taking now.  She is now about 4 years out.  She did have a right nephrectomy for renal cell carcinoma.  I think that this may have actually been a partial nephrectomy.  Again, this left breast is a real issue for Ms. Kruczek.  She has a definite pain in this breast.  She just wants to have a mastectomy so that she does not worried about hurting.  She is on Percocet.  She takes maybe 3 or 4 a day.  Dr. Dwain Sarna apparently told her that he did not want her to be on the Tylenol.  As such, we can switch her to something without Tylenol when she needs a refill.  I will plan to see her back myself in another 3 months.  She did promise to take pictures of all the  puppies.    ______________________________ Josph Macho, M.D. PRE/MEDQ  D:  09/25/2011  T:  09/25/2011  Job:  [id number]

## 2011-09-29 ENCOUNTER — Telehealth: Payer: Self-pay | Admitting: *Deleted

## 2011-09-29 NOTE — Telephone Encounter (Signed)
Message copied by Anselm Jungling on Tue Sep 29, 2011 12:42 PM ------      Message from: Arlan Organ R      Created: Mon Sep 28, 2011  6:41 PM       Call- labs look good.  [name]

## 2011-09-29 NOTE — Telephone Encounter (Signed)
Called patient to let her know that her labwork looked good per dr. Myna Hidalgo.  Also gave patient her next appts for lab and to see Dr. Myna Hidalgo

## 2011-10-12 ENCOUNTER — Other Ambulatory Visit: Payer: Self-pay | Admitting: *Deleted

## 2011-10-12 MED ORDER — OXYCODONE HCL 10 MG PO TABS: 10.0000 mg | ORAL_TABLET | Freq: Three times a day (TID) | ORAL | Status: DC | PRN

## 2011-10-12 NOTE — Telephone Encounter (Signed)
Pt called on Friday 10/09/11 asking to have her pain medication refilled today and to remind Dr Myna Hidalgo that he was going to change it. Reviewed with Dr Myna Hidalgo. To remove the tylenol portion of her pills thus to start Oxycodone 10 mg 1 q8h prn # 90. Routed to him for approval.

## 2011-10-30 ENCOUNTER — Other Ambulatory Visit: Payer: Self-pay | Admitting: *Deleted

## 2011-10-30 DIAGNOSIS — F329 Major depressive disorder, single episode, unspecified: Secondary | ICD-10-CM

## 2011-10-30 MED ORDER — OXYCODONE HCL 10 MG PO TABS: 10.0000 mg | ORAL_TABLET | Freq: Three times a day (TID) | ORAL | Status: DC | PRN

## 2011-10-30 MED ORDER — ALPRAZOLAM 1 MG PO TABS: 1.0000 mg | ORAL_TABLET | Freq: Three times a day (TID) | ORAL | Status: DC | PRN

## 2011-10-30 NOTE — Telephone Encounter (Signed)
Pt called earlier in the week requesting to have her Xanax refilled today. Will go ahead and provide a rx for Oxycodone to be filled on 11/09/11 to prevent another trip per Dr Myna Hidalgo. This is a chronic med for her.

## 2011-11-05 ENCOUNTER — Ambulatory Visit (INDEPENDENT_AMBULATORY_CARE_PROVIDER_SITE_OTHER): Payer: Self-pay | Admitting: General Surgery

## 2011-11-05 ENCOUNTER — Encounter (INDEPENDENT_AMBULATORY_CARE_PROVIDER_SITE_OTHER): Payer: Self-pay | Admitting: General Surgery

## 2011-11-05 VITALS — BP 144/96 | HR 92 | Temp 97.6°F | Resp 24 | Ht 64.0 in | Wt 260.2 lb

## 2011-11-05 DIAGNOSIS — Z853 Personal history of malignant neoplasm of breast: Secondary | ICD-10-CM

## 2011-11-05 DIAGNOSIS — N63 Unspecified lump in unspecified breast: Secondary | ICD-10-CM

## 2011-11-05 NOTE — Progress Notes (Signed)
Subjective:     Patient ID: [name], female   DOB: 1965-11-17, 46 y.o.   MRN: [id number]  HPI This is a 46 yo female with prior treatment of left breast cancer. She has some concerns about some masses in her left breast. I saw her last time and send her to get a mammogram as well as ultrasound. The main complaint she has is tenderness in these areas. Her mammogram shows some benign skin calcifications corresponded to the palpable finding as well as some coarse curvilinear calcifications consistent with some fat necrosis in the axilla. There also was a benign course calcifications present in the lumpectomy site consistent with fat necrosis. Ultrasound showed in the area near the sternal border showing fatty tissue likely consistent with some fat necrosis by my exam. There no abnormal nodes and no other abnormalities on her ultrasound.  She continues to have pain at these areas and she thinks they are enlarging as well.  She comes in today asking for a left mastectomy.  Recently her mother has also been diagnosed and treated for breast cancer.  Review of Systems     Objective:   Physical Exam  Pulmonary/Chest:         Assessment:     History breast cancer Left breast pain and difficult exam    Plan:     We discussed obtaining genetic testing as her mom has breast cancer now. That may make some of our discussions different if her genetic testing is positive. I am more concerned about her examination today. She does have a very difficult exam given the radiation changes and I think this is all radiation changes and secondary to surgery but is difficult for me to definitely say that today. I would like to obtain an MR as her mammogram didn't show anything before. I would see her back after MR and genetics. She very much would like a left mastectomy for a variety of reasons.

## 2011-11-06 ENCOUNTER — Telehealth: Payer: Self-pay | Admitting: *Deleted

## 2011-11-06 NOTE — Telephone Encounter (Signed)
Confirmed 11/16/11 genetics appt w/ pt.  Emailed Alisha at [facility] to make her aware.

## 2011-11-09 ENCOUNTER — Telehealth (INDEPENDENT_AMBULATORY_CARE_PROVIDER_SITE_OTHER): Payer: Self-pay | Admitting: General Surgery

## 2011-11-16 ENCOUNTER — Other Ambulatory Visit: Payer: Self-pay | Admitting: Lab

## 2011-11-16 ENCOUNTER — Ambulatory Visit (HOSPITAL_BASED_OUTPATIENT_CLINIC_OR_DEPARTMENT_OTHER): Payer: Self-pay | Admitting: Genetic Counselor

## 2011-11-16 DIAGNOSIS — C649 Malignant neoplasm of unspecified kidney, except renal pelvis: Secondary | ICD-10-CM

## 2011-11-16 DIAGNOSIS — C50919 Malignant neoplasm of unspecified site of unspecified female breast: Secondary | ICD-10-CM

## 2011-11-16 NOTE — Progress Notes (Signed)
Dr.  Dwain Sarna requested a consultation for genetic counseling and risk assessment for [name], a 46 y.o. female, for discussion of her personal history of breast and kidney cancer. she presents to clinic today, with her friend Shanda Bumps, to discuss the possibility of a genetic predisposition to cancer, and to further clarify her risks, as well as her family members' risks for cancer.   HISTORY OF PRESENT ILLNESS: In 2009, at the age of 60, [name] was diagnosed with breast cancer of the left breast.  In 2011, at the age of 7, [name] was diagnosed with renal cell carcinoma.    Past Medical History  Diagnosis Date  . Arthritis   . Asthma   . Chronic kidney disease   . Hypertension   . COPD (chronic obstructive pulmonary disease)   . GERD (gastroesophageal reflux disease)   . Breast pain in female 07/03/2011  . Cancer     kidney and breast  . Breast CA 07/03/2011    left  . Kidney carcinoma 07/03/2011    right    Past Surgical History  Procedure Date  . Bladder suspension 2012  . Bladder surgery 2012    bladder stretched  . Kidney surgery 2011    right kidney cancer 1/2 kidney removed   . Breast surgery 2008-2009    left breast lymph nodes   . Breast surgery 2008-2009    left breast lumpectomy   . Cholecystectomy 1985  . Foot surgery     right  . Abdominal hysterectomy     partial  . Oophorectomy     left  . Cesarean section   . Nasal sinus surgery     History  Substance Use Topics  . Smoking status: Current Everyday Smoker -- 0.3 packs/day  . Smokeless tobacco: Never Used  . Alcohol Use: No    REPRODUCTIVE HISTORY AND PERSONAL RISK ASSESSMENT FACTORS: Menarche was at age 16   Peri-menopausal Uterus Intact: No Ovaries Intact: One ovary intact G4P3A0, infant death1 , first live birth at age 75  She has not previously undergone treatment for infertility.   OCP use for 11 years   She has not used HRT in the past.    FAMILY HISTORY:  We  obtained a detailed, 4-generation family history.  Significant diagnoses are listed below: Family History  Problem Relation Age of Onset  . Kidney disease Father   . Cancer Mother     breast  . Cancer Paternal Aunt     unaware of 1, and 2nd had breast  The patient was diagnosed with breast cancer at age 61 and renal cell carcinoma at age 24.  Her mother was diagnosed with breast cancer at age 66.  There is no other reported cancer history on this side of the family.  The patients father died of kidney failure at age 33.  He had 11 brothers and sisters.  Two sisters were diagnosed with breast cancer over the age of 55 each.  One sister with breast cancer has a daughter with brain cancer x2.  It is unknown what type of brain cancer this is.  A third sister had a daughter pass away with melanoma under the age of 76.  A fourth sister had a daughter pass away with esophogeal cancer under the age of 63.  She was a non smoker.  There is no other reported family history of cancer on this side of the family.  Patient's maternal ancestors are of [place] descent,  and paternal ancestors are of unknown descent. There is no reported Ashkenazi Jewish ancestry. There is no known consanguinity.  GENETIC COUNSELING RISK ASSESSMENT, DISCUSSION, AND SUGGESTED FOLLOW UP: We reviewed the natural history and genetic etiology of sporadic, familial and hereditary cancer syndromes. About 5-10% of breast cancer is hereditary.  OF these, up to 85% is the result of BRCA1 and BRCA2 mutations.    Other hereditary cancer syndromes can be related to brain cancer (glioblastoma and astrocytoma), and other cancers.  We discussed trying to get more information about the other cancers in her family.  We reviewed the red flags of hereditary breast cancer syndromes and the dominant inheritance pattern.  The patient does not have insurance but should meet the financial criteria for [facility] to receive free testing.  The patient's  personal and family history is suggestive of the following possible diagnosis: hereditary breast cancer syndrome  We discussed that identification of a hereditary cancer syndrome may help her care providers tailor the patients medical management. If a mutation indicating hereditary breast cacner is detected in this case, the [employer] recommendations would include increased cancer surveillance and possible prophylactic surgery. If a mutation is detected, the patient will be referred back to the referring provider and to any additional appropriate care providers to discuss the relevant options.   If a mutation is not found in the patient, this will decrease the likelihood of hereditary breast cancer as the explanation for her personal history of breast cancer. Cancer surveillance options would be discussed for the patient according to the appropriate standard National Comprehensive Cancer Network and American Cancer Society guidelines, with consideration of their personal and family history risk factors. In this case, the patient will be referred back to their care providers for discussions of management.   In order to estimate her chance of having a BRCA1 or BRCA2 mutation, we used statistical models (Penn II) and laboratory data that take into account her personal medical history, family history and ancestry.  Because each model is different, there can be a lot of variability in the risks they give.  Therefore, these numbers must be considered a rough range and not a precise risk of having a BRCA1 or BRCA2 mutation.  These models estimate that she has approximately a 10-12% chance of having a mutation. Based on her paternal family history she has a 4% chance of testing positive for a BRCA1 mutation and a 6% chance of testing positive for a BRCA2 mutation.  Her maternal history gives her a risk of 6% for each a BRCA1 or BRCA2 mutation. Based on this assessment of her family and  personal history, genetic testing is recommended.  Based on the patient's personal and family history, statistical models (tyrer-cuzik)  and literature data were used to estimate her daughters risk of developing breast cancer. These estimate her daughters lifetime risk of developing breast cancer to be approximately 19.9%. This estimation does not take into account any genetic testing results.   After considering the risks, benefits, and limitations, the patient provided informed consent for  the following  Testing:  [facility] through [facility].   Per the patient's request, we will contact her by telephone to discuss these results. A follow up genetic counseling visit will be scheduled if indicated.  The patient was seen for a total of 60 minutes, greater than 50% of which was spent face-to-face counseling.  This plan is being carried out per Dr. Doreen Salvage recommendations.  This note will also  be sent to the referring provider via the electronic medical record. The patient will be supplied with a summary of this genetic counseling discussion as well as educational information on the discussed hereditary cancer syndromes following the conclusion of their visit.   Patient was discussed with Dr. Drue Second.   EDUCATIONAL INFORMATION SUPPLIED TO PATIENT AT ENCOUNTER:  Hereditary breast and ovarian cancer syndrome brochure  _______________________________________________________________________ For Office Staff:  Number of people involved in session: 4 Was an Intern/ student involved with case: yes

## 2011-11-20 ENCOUNTER — Ambulatory Visit
Admission: RE | Admit: 2011-11-20 | Discharge: 2011-11-20 | Disposition: A | Discharge status: Home / Self Care | Payer: Self-pay | Source: Ambulatory Visit | Attending: General Surgery | Admitting: General Surgery

## 2011-11-20 DIAGNOSIS — N63 Unspecified lump in unspecified breast: Secondary | ICD-10-CM

## 2011-11-20 MED ORDER — GADOBENATE DIMEGLUMINE 529 MG/ML IV SOLN
20.0000 mL | Freq: Once | INTRAVENOUS | Status: AC | PRN
  Administered 2011-11-20: 20 mL via INTRAVENOUS

## 2011-11-27 ENCOUNTER — Other Ambulatory Visit: Payer: Self-pay | Admitting: *Deleted

## 2011-11-27 DIAGNOSIS — F419 Anxiety disorder, unspecified: Secondary | ICD-10-CM

## 2011-11-27 MED ORDER — ALPRAZOLAM 1 MG PO TABS: 1.0000 mg | ORAL_TABLET | Freq: Three times a day (TID) | ORAL | Status: DC | PRN

## 2011-11-27 MED ORDER — OXYCODONE HCL 10 MG PO TABS: 10.0000 mg | ORAL_TABLET | Freq: Three times a day (TID) | ORAL | Status: DC | PRN

## 2011-11-27 NOTE — Telephone Encounter (Signed)
  Pt called earlier in the week requesting to have her Xanax & pain medication refilled today. Will go ahead and provide a rx for Oxycodone to be filled on 12/07/11 to prevent another trip per Dr Myna Hidalgo. This is a chronic med for her.

## 2011-12-07 ENCOUNTER — Encounter (INDEPENDENT_AMBULATORY_CARE_PROVIDER_SITE_OTHER): Payer: Self-pay | Admitting: General Surgery

## 2011-12-07 ENCOUNTER — Ambulatory Visit (INDEPENDENT_AMBULATORY_CARE_PROVIDER_SITE_OTHER): Payer: Self-pay | Admitting: General Surgery

## 2011-12-07 VITALS — BP 172/104 | HR 72 | Temp 96.9°F | Resp 18 | Ht 64.0 in | Wt 253.8 lb

## 2011-12-07 DIAGNOSIS — Z853 Personal history of malignant neoplasm of breast: Secondary | ICD-10-CM

## 2011-12-07 NOTE — Progress Notes (Addendum)
Subjective:     Patient ID: [name], female   DOB: Aug 31, 1965, 46 y.o.   MRN: [id number]  HPI This is a 46 yo female with prior treatment of left breast cancer. The main complaint she has is tenderness in these areas. Her mammogram shows some benign skin calcifications corresponded to the palpable finding as well as some coarse curvilinear calcifications consistent with some fat necrosis in the axilla. There also were benign course calcifications present in the lumpectomy site consistent with fat necrosis. Ultrasound showed in the area near the sternal border showing fatty tissue likely consistent with some fat necrosis by my exam. There no abnormal nodes and no other abnormalities on her ultrasound.  She has also recently undergone an MRI that is normal.  She returns today with complaints of soreness on that side as well as the skin changes as well.  She was recommended by Dr. Myna Hidalgo to have a mastectomy on that side. She also has recently had genetic testing that I am waiting for the report.   Review of Systems *RADIOLOGY REPORT*  Clinical Data: Patient with left lumpectomy radiation therapy for  breast cancer 2009. Recently she was evaluated for white nipple  discharge and three lumps in the left breast. A lump was felt in  the left axilla on clinical exam. Bilateral diagnostic mammogram  and left breast ultrasound was performed in January 2013, and no  mammographic or sonographic evidence of malignancy was identified.  History of right breast biopsy in 2009 with benign results. The  patient has a history of partial right nephrectomy for renal  carcinoma in 2011.  BILATERAL BREAST MRI WITH AND WITHOUT CONTRAST  Technique: Multiplanar, multisequence MR images of both breasts  were obtained prior to and following the intravenous administration  of 20ml of Multihance. Three dimensional images were evaluated at  the independent DynaCad workstation.  Comparison: Bilateral breast MRI  05/14/2008 and bilateral  diagnostic mammogram 07/29/2011  Findings: There is a moderate parenchymal enhancement pattern in  the right breast and a mild background parenchymal enhancement  pattern in the left breast, which has been treated with lumpectomy  and radiation. There is a 1.0 cm lumpectomy cavity in the upper  central left breast. There is no suspicious enhancement at the  lumpectomy site. No mass or suspicious enhancement is identified in  the left breast.  In the anterior third of the outer right breast is an enhancing 10  mm circumscribed nodule with central biopsy clip. This corresponds  to the mammographically stable right breast nodule which was  biopsied in 2009 with reported benign results. There are scattered  foci of enhancement in the right breast. No suspicious enhancement  is identified in the right breast to suggest malignancy.  No axillary or internal mammary chain lymphadenopathy is  identified.  IMPRESSION:  No MRI evidence of malignancy in either breast. Lumpectomy changes  in the left breast and stable previously biopsied benign right  breast mass. Bilateral diagnostic mammogram in January 2014 is  recommended.  THREE-DIMENSIONAL MR IMAGE RENDERING ON INDEPENDENT WORKSTATION:  Three-dimensional MR images were rendered by post-processing of the  original MR data on an independent workstation. The three-  dimensional MR images were interpreted, and findings were reported  in the accompanying complete MRI report for this study.  BI-RADS CATEGORY 2: Benign finding(s).     Objective:   Physical Exam Unchanged, today from last visit    Assessment:     History of left breast cancer, stage  I    Plan:     Her genetic testing by report is negative. Last time she had talked about a mastectomy on the left side. Her MRI is normal. I think her exam as well as all of her studies are most consistent with posttreatment changes of the left breast. The one other test  we did today was a 6 mm punch biopsy with the affected skin. This was done after cleansing the area and then the suture was placed and will be removed in 7-10 days.  If this is negative then I think one of the options will be just following this breast. I think she is at high risk for having wound issues and I do not think a mastectomy is going to cure her complaints of pain in that breast. I'll see her back after the punch biopsies back and we'll come up with a final plan.     Her bp is also very elevated today associated with a headache.  I strongly recommended she go to the er today.

## 2011-12-08 ENCOUNTER — Telehealth: Payer: Self-pay | Admitting: *Deleted

## 2011-12-08 NOTE — Telephone Encounter (Signed)
Pt called to request an adjustment in her BP medication. She went to see Dr. Dwain Sarna yesterday who told her that her BP was at "stroke level" and recommended she go to the ER. Ms. Senegal stated that she was not going to go to the ER. The "suggestion" that was made to her was to have the lisinopril increased from 10 mg to 20 mg and keep the Norvasc the same at 10 mg but since Dr. Myna Hidalgo was the one to prescribe it, he needed to do this. Asked where she was in the process of obtaining a PCP and she stated "I have turned in the application in [place] and spoke to the SW today. She said she has my application and it should take about a week". Reviewed with Dr Myna Hidalgo. He stated that she needed to be evaluated at an Urgent Care. This was left on her personal voicemail as she did not answer when her call was returned.

## 2011-12-09 ENCOUNTER — Telehealth: Payer: Self-pay | Admitting: Genetic Counselor

## 2011-12-09 NOTE — Telephone Encounter (Signed)
Revealed negative BRCA/BART testing.  Told her that we had faxed to Dr. Dwain Sarna.  Additionally, suggested that she should get an eye exam from an opthalmologist to look for retinal adenomas, as with her RCC, it may point [place] in another direction for genetic testing.

## 2011-12-11 ENCOUNTER — Telehealth (INDEPENDENT_AMBULATORY_CARE_PROVIDER_SITE_OTHER): Payer: Self-pay

## 2011-12-11 ENCOUNTER — Encounter: Payer: Self-pay | Admitting: Genetic Counselor

## 2011-12-11 NOTE — Telephone Encounter (Signed)
Pt called to give br bx results that area is ok b/c it's all related to radiation changes per Dr Dwain Sarna. The pt has f/u appt with Dr Dwain Sarna on 6/3.

## 2011-12-21 ENCOUNTER — Encounter (INDEPENDENT_AMBULATORY_CARE_PROVIDER_SITE_OTHER): Payer: Self-pay | Admitting: General Surgery

## 2011-12-25 ENCOUNTER — Ambulatory Visit (INDEPENDENT_AMBULATORY_CARE_PROVIDER_SITE_OTHER): Payer: Self-pay | Admitting: General Surgery

## 2011-12-25 ENCOUNTER — Encounter (INDEPENDENT_AMBULATORY_CARE_PROVIDER_SITE_OTHER): Payer: Self-pay | Admitting: General Surgery

## 2011-12-25 VITALS — BP 142/96 | HR 71 | Temp 97.0°F | Resp 14 | Ht 64.0 in | Wt 247.4 lb

## 2011-12-25 DIAGNOSIS — Z853 Personal history of malignant neoplasm of breast: Secondary | ICD-10-CM

## 2011-12-25 NOTE — Patient Instructions (Signed)

## 2011-12-25 NOTE — Progress Notes (Signed)
Subjective:     Patient ID: [name], female   DOB: 09/15/1965, 46 y.o.   MRN: [id number]  HPI This is a 46 yo female with prior treatment of left breast cancer with lumpectomy, snbx and xrt. The main complaint she has is tenderness in these areas. Her mammogram shows some benign skin calcifications corresponded to the palpable finding as well as some coarse curvilinear calcifications consistent with some fat necrosis in the axilla. There also were benign course calcifications present in the lumpectomy site consistent with fat necrosis. Ultrasound showed in the area near the sternal border showing fatty tissue likely consistent with some fat necrosis by my exam. There no abnormal nodes and no other abnormalities on her ultrasound.  Her breast mri shows no real abnormalities and I recently did a punch biopsy in the region of the skin changes that is mostly consistent with radiation changes.  Her genetic testing is also negative as her mom has had breast cancer now.   Review of Systems     Objective:   Physical Exam Left breast unchanged since last visit, I removed stitch from punch biopsy    Assessment:     History of breast cancer in female    Plan:     I think to the best of my ability we have ruled out a recurrent cancer which was her concern.  I think she has changes associated with treatment.  I don't think proceeding with a mastectomy is going to help her at this time and she is agreeable to that.  I will plan on following her in 3 months just to make sure things are going ok and she will follow up with Dr. Myna Hidalgo next week.

## 2012-01-01 ENCOUNTER — Other Ambulatory Visit (HOSPITAL_BASED_OUTPATIENT_CLINIC_OR_DEPARTMENT_OTHER): Payer: Self-pay | Admitting: Lab

## 2012-01-01 ENCOUNTER — Ambulatory Visit (HOSPITAL_BASED_OUTPATIENT_CLINIC_OR_DEPARTMENT_OTHER): Payer: Self-pay | Admitting: Hematology & Oncology

## 2012-01-01 ENCOUNTER — Other Ambulatory Visit: Payer: Self-pay | Admitting: Hematology & Oncology

## 2012-01-01 VITALS — BP 145/87 | HR 59 | Temp 97.2°F | Ht 64.0 in | Wt 250.0 lb

## 2012-01-01 DIAGNOSIS — M810 Age-related osteoporosis without current pathological fracture: Secondary | ICD-10-CM

## 2012-01-01 DIAGNOSIS — N644 Mastodynia: Secondary | ICD-10-CM

## 2012-01-01 DIAGNOSIS — I82A21 Chronic embolism and thrombosis of right axillary vein: Secondary | ICD-10-CM

## 2012-01-01 DIAGNOSIS — C50419 Malignant neoplasm of upper-outer quadrant of unspecified female breast: Secondary | ICD-10-CM

## 2012-01-01 DIAGNOSIS — F419 Anxiety disorder, unspecified: Secondary | ICD-10-CM

## 2012-01-01 DIAGNOSIS — C649 Malignant neoplasm of unspecified kidney, except renal pelvis: Secondary | ICD-10-CM

## 2012-01-01 DIAGNOSIS — F329 Major depressive disorder, single episode, unspecified: Secondary | ICD-10-CM

## 2012-01-01 DIAGNOSIS — R52 Pain, unspecified: Secondary | ICD-10-CM

## 2012-01-01 DIAGNOSIS — Z86718 Personal history of other venous thrombosis and embolism: Secondary | ICD-10-CM

## 2012-01-01 LAB — CBC WITH DIFFERENTIAL (CANCER CENTER ONLY)
BASO%: 0.3 % (ref 0.0–2.0)
EOS%: 4.1 % (ref 0.0–7.0)
HCT: 42.3 % (ref 34.8–46.6)
LYMPH#: 2.1 10*3/uL (ref 0.9–3.3)
MCHC: 34.5 g/dL (ref 32.0–36.0)
MONO#: 0.5 10*3/uL (ref 0.1–0.9)
NEUT#: 6.4 10*3/uL (ref 1.5–6.5)
NEUT%: 68 % (ref 39.6–80.0)
Platelets: 162 10*3/uL (ref 145–400)
RDW: 13.5 % (ref 11.1–15.7)
WBC: 9.4 10*3/uL (ref 3.9–10.0)

## 2012-01-01 MED ORDER — OXYCODONE HCL 15 MG PO TABS: 15.0000 mg | ORAL_TABLET | Freq: Three times a day (TID) | ORAL | Status: DC | PRN

## 2012-01-01 MED ORDER — ALPRAZOLAM 1 MG PO TABS: 1.0000 mg | ORAL_TABLET | Freq: Three times a day (TID) | ORAL | Status: DC | PRN

## 2012-01-01 NOTE — Progress Notes (Signed)
CC:   Juanetta Gosling, MD  DIAGNOSES: 1. 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 2. 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 3. History of deep venous thrombosis of the right arm. 4. Chronic breast pain.  CURRENT THERAPY:  Fareston 60 mg p.o. daily.  INTERIM HISTORY:  [name] comes in for followup.  She has been followed by Dr. Dwain Sarna recently.  He actually did do a biopsy on the left breast.  This only showed dermal fibrosis with superficial and perivascular inflammation.  There was no evidence of malignancy.  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  Dr. Dwain Sarna did not feel that she was in need of a mastectomy.  [name] wishes that she would have a mastectomy because of all the pain.  She also is now complaining of pain in the right arm.  She says this feels the same as it did when she had her blood clot.  This has been going on for about a month.  Of course, she admits she did not tell anybody about this.  She also was complaining of pain in the left leg.  She is on Fareston.  This might be considered a causative agent for thromboembolic disease.  She has been on Fareston now for 4 years.  She, I think takes it on a regular basis.  Otherwise, [name] is doing okay.  She is not having any kind of pelvic pain.  She is having no cough.  She is still smoking.  PHYSICAL EXAMINATION:  General:  This is an obese white female in mild distress secondary to her chronic breast pain.  Vital signs:  Show a temperature of 97.8, pulse 59, respiratory rate 22, blood pressure 145/87.  Weight is 250.  Head and neck:  Exam shows a normocephalic, atraumatic skull.  There are no ocular or oral lesions.  There are no palpable cervical or supraclavicular lymph nodes.  Lungs:  Clear bilaterally.  Cardiac:  Regular rate and rhythm with a normal S1 and S2. There are no murmurs, rubs or bruits.  Abdomen:  Soft with good  bowel sounds.  There is no palpable abdominal mass.  There is no palpable hepatosplenomegaly.  Breast exam shows right breast with no masses, edema or erythema.  There is no right axillary adenopathy.  Left breast shows marked changes secondary to radiation and surgery.  She has contraction of the left breast.  She has tenderness throughout the left breast.  She has these macular papular type lesions all throughout the left breast.  There is no left axillary adenopathy.  Back:  No tenderness over spine, ribs or hips.  Extremities:  Shows decreased range of motion of the right arm.  There may be some slight swelling in the right arm.  There is tenderness to palpation in the right upper arm. She has good pulses in distal extremities.  She has mild swelling of the left leg.  She has no palpable venous cord in the left leg.  LABORATORY STUDIES:  White cell count 9.4, hemoglobin 14.6, hematocrit 42.3, platelet count 162.  IMPRESSION:  [name] is a 46 year old female with history of stage I ductal carcinoma of the left breast.  Her problem really has been complication from her treatments.  She has terrible pain in the left breast.  She is on oxycodone for this.  We will refill this today. I suspect that if she does have another DVT she is probably going  to need to be on Xarelto.  I will also probably get her off Fareston.  I want to see her back in a month's time.  With [name], she has multiple issues.  She is doing her best to take care of these and not get too anxious.    ______________________________ Josph Macho, M.D. PRE/MEDQ  D:  01/01/2012  T:  01/01/2012  Job:  [id number]

## 2012-01-01 NOTE — Progress Notes (Signed)
This office note has been dictated.

## 2012-01-02 LAB — COMPREHENSIVE METABOLIC PANEL
ALT: 11 U/L (ref 0–35)
AST: 15 U/L (ref 0–37)
Calcium: 9.1 mg/dL (ref 8.4–10.5)
Chloride: 106 mEq/L (ref 96–112)
Creatinine, Ser: 0.76 mg/dL (ref 0.50–1.10)
Sodium: 139 mEq/L (ref 135–145)
Total Protein: 6.7 g/dL (ref 6.0–8.3)

## 2012-01-04 ENCOUNTER — Encounter (HOSPITAL_BASED_OUTPATIENT_CLINIC_OR_DEPARTMENT_OTHER): Payer: Self-pay

## 2012-01-04 ENCOUNTER — Inpatient Hospital Stay (HOSPITAL_BASED_OUTPATIENT_CLINIC_OR_DEPARTMENT_OTHER): Admission: RE | Admit: 2012-01-04 | Payer: Self-pay | Source: Ambulatory Visit

## 2012-01-04 ENCOUNTER — Other Ambulatory Visit (HOSPITAL_BASED_OUTPATIENT_CLINIC_OR_DEPARTMENT_OTHER): Payer: Self-pay

## 2012-01-08 ENCOUNTER — Telehealth: Payer: Self-pay | Admitting: *Deleted
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
## 2012-01-29 ENCOUNTER — Other Ambulatory Visit: Payer: Self-pay | Admitting: *Deleted

## 2012-01-29 DIAGNOSIS — F419 Anxiety disorder, unspecified: Secondary | ICD-10-CM

## 2012-01-29 DIAGNOSIS — I82A21 Chronic embolism and thrombosis of right axillary vein: Secondary | ICD-10-CM

## 2012-01-29 DIAGNOSIS — N644 Mastodynia: Secondary | ICD-10-CM

## 2012-01-29 MED ORDER — OXYCODONE HCL 15 MG PO TABS: 15.0000 mg | ORAL_TABLET | Freq: Three times a day (TID) | ORAL | Status: DC | PRN

## 2012-01-29 MED ORDER — ALPRAZOLAM 1 MG PO TABS: 1.0000 mg | ORAL_TABLET | Freq: Three times a day (TID) | ORAL | Status: DC | PRN

## 2012-01-29 NOTE — Telephone Encounter (Signed)
Pt called on Wed stating "I will be in on Friday to pick up my scripts". Explained that this would need to be reviewed by Dr Myna Hidalgo before this was done. Gave the message to Dr Myna Hidalgo. He spoke to the pt over the phone about her previous prescriptions and agreed to do it this time but there can be no further issues.   After discussing, it was determined by Dr Myna Hidalgo to provide the pt with 2 WEEK PRESCRIPTIONS. He spoke to the pt's boyfriend Algernon Huxley about this when he came to pick them up.

## 2012-02-12 ENCOUNTER — Other Ambulatory Visit: Payer: Self-pay | Admitting: *Deleted

## 2012-02-12 DIAGNOSIS — N3010 Interstitial cystitis (chronic) without hematuria: Secondary | ICD-10-CM | POA: Insufficient documentation

## 2012-02-12 DIAGNOSIS — N3946 Mixed incontinence: Secondary | ICD-10-CM | POA: Insufficient documentation

## 2012-02-12 DIAGNOSIS — F419 Anxiety disorder, unspecified: Secondary | ICD-10-CM

## 2012-02-12 DIAGNOSIS — I82A21 Chronic embolism and thrombosis of right axillary vein: Secondary | ICD-10-CM

## 2012-02-12 DIAGNOSIS — N644 Mastodynia: Secondary | ICD-10-CM

## 2012-02-12 MED ORDER — OXYCODONE HCL 15 MG PO TABS: 15.0000 mg | ORAL_TABLET | Freq: Three times a day (TID) | ORAL | Status: DC | PRN

## 2012-02-12 MED ORDER — ALPRAZOLAM 1 MG PO TABS: 1.0000 mg | ORAL_TABLET | Freq: Three times a day (TID) | ORAL | Status: DC | PRN

## 2012-02-12 NOTE — Telephone Encounter (Signed)
Pt called yesterday stating "Algernon Huxley will be in after 2pm tomorrow pick up my scripts. Dr. Myna Hidalgo has me on a 2 week schedule for my prescriptions now". After reviewing her chart, told her that would be ok as her last rx was on 01/29/12.

## 2012-02-15 DIAGNOSIS — C649 Malignant neoplasm of unspecified kidney, except renal pelvis: Secondary | ICD-10-CM | POA: Insufficient documentation

## 2012-02-25 ENCOUNTER — Other Ambulatory Visit: Payer: Self-pay | Admitting: *Deleted

## 2012-02-25 DIAGNOSIS — I82A21 Chronic embolism and thrombosis of right axillary vein: Secondary | ICD-10-CM

## 2012-02-25 DIAGNOSIS — N644 Mastodynia: Secondary | ICD-10-CM

## 2012-02-25 DIAGNOSIS — F419 Anxiety disorder, unspecified: Secondary | ICD-10-CM

## 2012-02-25 MED ORDER — ALPRAZOLAM 1 MG PO TABS: 1.0000 mg | ORAL_TABLET | Freq: Three times a day (TID) | ORAL | Status: DC | PRN

## 2012-02-25 MED ORDER — OXYCODONE HCL 15 MG PO TABS: 15.0000 mg | ORAL_TABLET | Freq: Three times a day (TID) | ORAL | Status: DC | PRN

## 2012-02-25 NOTE — Telephone Encounter (Signed)
Pt called yesterday stating "[name] will be in after 3pm tomorrow pick up my scripts". After reviewing her chart, she is due for her 2 week refill on 02/26/12. Will review with Eunice Blase, PA in Dr Gustavo Lah absence.

## 2012-03-11 ENCOUNTER — Other Ambulatory Visit: Payer: Self-pay | Admitting: *Deleted

## 2012-03-11 DIAGNOSIS — F329 Major depressive disorder, single episode, unspecified: Secondary | ICD-10-CM

## 2012-03-11 DIAGNOSIS — I82A21 Chronic embolism and thrombosis of right axillary vein: Secondary | ICD-10-CM

## 2012-03-11 DIAGNOSIS — N644 Mastodynia: Secondary | ICD-10-CM

## 2012-03-11 MED ORDER — ALPRAZOLAM 1 MG PO TABS: 1.0000 mg | ORAL_TABLET | Freq: Three times a day (TID) | ORAL | Status: DC | PRN

## 2012-03-11 MED ORDER — OXYCODONE HCL 15 MG PO TABS: 15.0000 mg | ORAL_TABLET | Freq: Three times a day (TID) | ORAL | Status: DC | PRN

## 2012-03-11 NOTE — Telephone Encounter (Signed)
Pt left a message Wednesday stating "I will need to have my prescriptions ready by Friday at 11am". Her rx were last filled on 02/26/12 (she is on a 2 week schedule). Will route to Dr Myna Hidalgo for approval then place at the front desk for pick up.

## 2012-03-22 ENCOUNTER — Ambulatory Visit (INDEPENDENT_AMBULATORY_CARE_PROVIDER_SITE_OTHER): Payer: Self-pay | Admitting: General Surgery

## 2012-03-24 ENCOUNTER — Encounter (INDEPENDENT_AMBULATORY_CARE_PROVIDER_SITE_OTHER): Payer: Self-pay | Admitting: General Surgery

## 2012-03-25 ENCOUNTER — Other Ambulatory Visit: Payer: Self-pay | Admitting: *Deleted

## 2012-03-25 DIAGNOSIS — N644 Mastodynia: Secondary | ICD-10-CM

## 2012-03-25 DIAGNOSIS — F419 Anxiety disorder, unspecified: Secondary | ICD-10-CM

## 2012-03-25 DIAGNOSIS — I82A21 Chronic embolism and thrombosis of right axillary vein: Secondary | ICD-10-CM

## 2012-03-25 MED ORDER — ALPRAZOLAM 1 MG PO TABS: 1.0000 mg | ORAL_TABLET | Freq: Three times a day (TID) | ORAL | Status: DC | PRN

## 2012-03-25 MED ORDER — OXYCODONE HCL 5 MG PO TABS: 15.0000 mg | ORAL_TABLET | Freq: Three times a day (TID) | ORAL | Status: DC | PRN

## 2012-03-28 ENCOUNTER — Other Ambulatory Visit: Payer: Self-pay | Admitting: *Deleted

## 2012-03-28 DIAGNOSIS — N644 Mastodynia: Secondary | ICD-10-CM

## 2012-03-28 DIAGNOSIS — I82A21 Chronic embolism and thrombosis of right axillary vein: Secondary | ICD-10-CM

## 2012-03-28 DIAGNOSIS — F329 Major depressive disorder, single episode, unspecified: Secondary | ICD-10-CM

## 2012-03-28 MED ORDER — OXYCODONE HCL 15 MG PO TABS: 15.0000 mg | ORAL_TABLET | Freq: Three times a day (TID) | ORAL | Status: DC | PRN

## 2012-03-28 NOTE — Telephone Encounter (Signed)
Received a call from [facility] stating the pt called with the following "I'm on my way over to pick up my new prescription once my ride gets here because they messed up my oxycodone on Friday". Reviewed rx with Toni Amend, pharmacist. Will change rx back to 15 mg and give her 30 tabs to get her through until her next 15 day refill period.

## 2012-04-01 ENCOUNTER — Ambulatory Visit (HOSPITAL_BASED_OUTPATIENT_CLINIC_OR_DEPARTMENT_OTHER): Payer: Self-pay | Admitting: Hematology & Oncology

## 2012-04-01 ENCOUNTER — Other Ambulatory Visit (HOSPITAL_BASED_OUTPATIENT_CLINIC_OR_DEPARTMENT_OTHER): Payer: Self-pay | Admitting: Lab

## 2012-04-01 VITALS — BP 129/72 | HR 67 | Temp 97.6°F | Resp 20 | Ht 64.0 in | Wt 244.0 lb

## 2012-04-01 DIAGNOSIS — C50919 Malignant neoplasm of unspecified site of unspecified female breast: Secondary | ICD-10-CM

## 2012-04-01 DIAGNOSIS — C50419 Malignant neoplasm of upper-outer quadrant of unspecified female breast: Secondary | ICD-10-CM

## 2012-04-01 DIAGNOSIS — R21 Rash and other nonspecific skin eruption: Secondary | ICD-10-CM

## 2012-04-01 DIAGNOSIS — R52 Pain, unspecified: Secondary | ICD-10-CM

## 2012-04-01 LAB — CBC WITH DIFFERENTIAL (CANCER CENTER ONLY)
BASO%: 0.2 % (ref 0.0–2.0)
EOS%: 4.4 % (ref 0.0–7.0)
LYMPH#: 2.2 10*3/uL (ref 0.9–3.3)
MCHC: 34.5 g/dL (ref 32.0–36.0)
NEUT#: 5.3 10*3/uL (ref 1.5–6.5)
NEUT%: 62.1 % (ref 39.6–80.0)
Platelets: 237 10*3/uL (ref 145–400)
RDW: 13 % (ref 11.1–15.7)
WBC: 8.5 10*3/uL (ref 3.9–10.0)

## 2012-04-01 LAB — COMPREHENSIVE METABOLIC PANEL
ALT: 18 U/L (ref 0–35)
AST: 18 U/L (ref 0–37)
Calcium: 9.1 mg/dL (ref 8.4–10.5)
Chloride: 104 mEq/L (ref 96–112)
Creatinine, Ser: 0.79 mg/dL (ref 0.50–1.10)
Potassium: 3.7 mEq/L (ref 3.5–5.3)
Sodium: 139 mEq/L (ref 135–145)
Total Protein: 6.6 g/dL (ref 6.0–8.3)

## 2012-04-01 MED ORDER — PREDNISONE 20 MG PO TABS: ORAL_TABLET | ORAL | Status: DC

## 2012-04-01 NOTE — Progress Notes (Signed)
This office note has been dictated.
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
## 2012-04-08 ENCOUNTER — Other Ambulatory Visit: Payer: Self-pay | Admitting: *Deleted

## 2012-04-08 DIAGNOSIS — F419 Anxiety disorder, unspecified: Secondary | ICD-10-CM

## 2012-04-08 DIAGNOSIS — I82A21 Chronic embolism and thrombosis of right axillary vein: Secondary | ICD-10-CM

## 2012-04-08 DIAGNOSIS — N644 Mastodynia: Secondary | ICD-10-CM

## 2012-04-08 MED ORDER — OXYCODONE HCL 15 MG PO TABS: 15.0000 mg | ORAL_TABLET | Freq: Three times a day (TID) | ORAL | Status: AC | PRN

## 2012-04-08 MED ORDER — ALPRAZOLAM 1 MG PO TABS: 1.0000 mg | ORAL_TABLET | Freq: Three times a day (TID) | ORAL | Status: DC | PRN

## 2012-04-08 NOTE — Telephone Encounter (Signed)
Pt called yesterday stating "I will be in around 11am tomorrow pick up my scripts". After reviewing her chart, she is due for her 2 week refill. Will route to Dr Myna Hidalgo for approval.

## 2012-04-15 ENCOUNTER — Ambulatory Visit: Payer: Self-pay | Admitting: Hematology & Oncology

## 2012-04-22 ENCOUNTER — Telehealth: Payer: Self-pay | Admitting: Hematology & Oncology

## 2012-04-22 ENCOUNTER — Ambulatory Visit (HOSPITAL_BASED_OUTPATIENT_CLINIC_OR_DEPARTMENT_OTHER): Payer: Self-pay | Admitting: Hematology & Oncology

## 2012-04-22 ENCOUNTER — Telehealth (INDEPENDENT_AMBULATORY_CARE_PROVIDER_SITE_OTHER): Payer: Self-pay | Admitting: General Surgery

## 2012-04-22 VITALS — BP 139/90 | HR 83 | Temp 98.0°F | Resp 18 | Ht 64.0 in | Wt 242.0 lb

## 2012-04-22 DIAGNOSIS — N644 Mastodynia: Secondary | ICD-10-CM

## 2012-04-22 DIAGNOSIS — C50419 Malignant neoplasm of upper-outer quadrant of unspecified female breast: Secondary | ICD-10-CM

## 2012-04-22 DIAGNOSIS — R21 Rash and other nonspecific skin eruption: Secondary | ICD-10-CM

## 2012-04-22 DIAGNOSIS — I82A21 Chronic embolism and thrombosis of right axillary vein: Secondary | ICD-10-CM

## 2012-04-22 DIAGNOSIS — F329 Major depressive disorder, single episode, unspecified: Secondary | ICD-10-CM

## 2012-04-22 MED ORDER — OXYCODONE HCL 15 MG PO TABS: ORAL_TABLET | ORAL | Status: DC

## 2012-04-22 MED ORDER — ALPRAZOLAM 1 MG PO TABS: 1.0000 mg | ORAL_TABLET | Freq: Three times a day (TID) | ORAL | Status: DC | PRN

## 2012-04-22 NOTE — Telephone Encounter (Signed)
Scheduled 05-12-12 10am appointment for pt with Dr. Dorita Sciara office. Pt is aware she will have to pay $85 at the time of the visit.

## 2012-04-22 NOTE — Telephone Encounter (Signed)
Called pt and tried to leave a voicemail but there was not one set up.  I was calling to tell her that I have scheduled an appt for her to see Dr. Dwain Sarna on 10/15 at 10:30.  Because I was unable to reach her, I mailed her an appt card.

## 2012-04-22 NOTE — Telephone Encounter (Signed)
Message copied by Littie Deeds on Fri Apr 22, 2012  4:12 PM ------      Message from: [place], [place]      Created: Fri Apr 22, 2012  4:09 PM      Regarding: FW:       Penni Bombard      Would you please call her and have her come in.        Thanks      MW                  ----- Message -----         From: Josph Macho, MD         Sent: 04/22/2012   3:45 PM           To: Emelia Loron, MD            Matt:            She does NOT have an appt.  Please call her.            Thanks!!            Cindee Lame

## 2012-04-22 NOTE — Progress Notes (Signed)
This office note has been dictated.

## 2012-04-23 NOTE — Progress Notes (Signed)
CC:   Juanetta Gosling, MD  DIAGNOSES: 1. 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 2. 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 3. Chronic breast pain. 4. Undefined skin lesions.  CURRENT THERAPY:  Fareston 60 mg p.o. daily.  I am still not sure if patient is taking this religiously.  INTERIM HISTORY:  Ms. Pillard comes in for followup.  Unfortunately, the rash has not gotten better.  We tried prednisone on her.  She still has this.  We will have to refer her to Dermatology.  She really wants to have her breasts taken off.  We will have to see about talking to Dr. Dwain Sarna about him doing this.  She just is tired of having all this pain and aggravation.  She really feels that the breasts are causing this rash that she has.  She is a little bit more at peace now.  All her family has moved out of her trailer.  She has much more calm and no distractions.  She is still smoking quite a bit, unfortunately.  PHYSICAL EXAMINATION:  This is a well-developed, well-nourished white female in no obvious distress.  Vital signs 98, pulse 83, respiratory rate 18, blood pressure 139/90.  Weight is 242.  Head and neck: Normocephalic, atraumatic skull.  There are no ocular or oral lesions. There are no palpable cervical or supraclavicular lymph nodes.  Lungs: Clear bilaterally.  There may be some slight decrease at the bases. Cardiac:  Regular rate and rhythm with a normal S1 and S2.  There are no murmurs, rubs or bruits.  Abdomen:  Soft with good bowel sounds.  There is no palpable abdominal mass.  No palpable hepatosplenomegaly. Extremities:  Some slight pitting edema in her legs.  Skin: Maculopapular rash all on her legs.  It is also on her abdomen and back. They are non-blanching.  They are somewhat thickened to touch. Neurologic:  No focal neurological deficits.  IMPRESSION:  Ms. Ladouceur is a 46 year old female.  She has history  of stage I infiltrating ductal carcinoma of the left breast.  There underwent a lumpectomy followed by radiation.  This, in my mind, is not an issue at all.  Again, this rash is really a problem.  We will see what Dermatology says.  Again, I do not have any problem with her having mastectomies.  She just worries about the mastectomy, about her breasts.  She is tired of hurting because of her breast pain.  Again, she feels that the breast is causing this rash.  We will go ahead and plan to get her back to see [place] in another 3 months. I will see about Dr. Dwain Sarna and whether not he will do a mastectomy.    ______________________________ Josph Macho, M.D. PRE/MEDQ  D:  04/22/2012  T:  04/23/2012  Job:  [id number]

## 2012-05-03 ENCOUNTER — Encounter (INDEPENDENT_AMBULATORY_CARE_PROVIDER_SITE_OTHER): Payer: Self-pay | Admitting: General Surgery

## 2012-05-03 ENCOUNTER — Ambulatory Visit (INDEPENDENT_AMBULATORY_CARE_PROVIDER_SITE_OTHER): Payer: Self-pay | Admitting: General Surgery

## 2012-05-03 VITALS — BP 132/84 | HR 76 | Temp 97.8°F | Resp 18 | Ht 64.0 in | Wt 234.2 lb

## 2012-05-03 DIAGNOSIS — N644 Mastodynia: Secondary | ICD-10-CM

## 2012-05-03 NOTE — Progress Notes (Signed)
Subjective:     Patient ID: [name], female   DOB: February 15, 1966, 46 y.o.   MRN: [id number]  HPI 75 yof who has undergone a left breast lumpectomy, sentinel node biopsy and radiation therapy for left breast cancer.  She has had a lot of issues with "leathery" skin on the left breast and soreness that have been worsening over some time.  She has a nl mri done earlier this year due to concerns over her exam.  She is due for her mmg in January 2014.  Her last mammogram shows a benign skin calcifications as well as some coarse curvilinear calcifications consistent with fat necrosis in her axilla. There are also some benign calcifications in the lumpectomy site. An ultrasound of another area that she had before showed likely fatty necrosis. Her genetic testing previously all been negative. I also done a punch biopsy in the region of the skin changes that showed consistent with radiation changes. She is concerned about the soreness as well as her appearance. She also is very concerned about her cancer risk moving forward. I then contacted by her medical oncologist to discuss a possible unilateral or bilateral mastectomy given all of these things. She also states she she has had one episode of non-bloody discharge from her left nipple recently.  Review of Systems BILATERAL BREAST MRI WITH AND WITHOUT CONTRAST  Technique: Multiplanar, multisequence MR images of both breasts  were obtained prior to and following the intravenous administration  of 20ml of Multihance. Three dimensional images were evaluated at  the independent DynaCad workstation.  Comparison: Bilateral breast MRI 05/14/2008 and bilateral  diagnostic mammogram 07/29/2011  Findings: There is a moderate parenchymal enhancement pattern in  the right breast and a mild background parenchymal enhancement  pattern in the left breast, which has been treated with lumpectomy  and radiation. There is a 1.0 cm lumpectomy cavity in the upper  central  left breast. There is no suspicious enhancement at the  lumpectomy site. No mass or suspicious enhancement is identified in  the left breast.  In the anterior third of the outer right breast is an enhancing 10  mm circumscribed nodule with central biopsy clip. This corresponds  to the mammographically stable right breast nodule which was  biopsied in 2009 with reported benign results. There are scattered  foci of enhancement in the right breast. No suspicious enhancement  is identified in the right breast to suggest malignancy.  No axillary or internal mammary chain lymphadenopathy is  identified.  IMPRESSION:  No MRI evidence of malignancy in either breast. Lumpectomy changes  in the left breast and stable previously biopsied benign right  breast mass. Bilateral diagnostic mammogram in January 2014 is  recommended.     Objective:   Physical Exam  Pulmonary/Chest: Right breast exhibits no inverted nipple, no mass, no nipple discharge, no skin change and no tenderness. Left breast exhibits skin change (very thick "leathery" skin) and tenderness. Left breast exhibits no inverted nipple, no mass and no nipple discharge. Breasts are asymmetrical (right breast greater than left in size).  Lymphadenopathy:    She has no cervical adenopathy.    She has no axillary adenopathy.       Right: No supraclavicular adenopathy present.       Left: No supraclavicular adenopathy present.       Assessment:     History left breast cancer    Plan:     I know the patient well after treatment for left breast  cancer. I think to the best of our abilities we have ruled out a recurrence or any breast cancer on the left side right now. She continues to have some problems at all. Her issues she relayed to me today are the soreness that she associates with that breast as well as its appearance when she looks at her self. We talked about mastectomy on that side and I assured her that I did not think a mastectomy  would help either of those problems. A mastectomy for her concern about a recurrence which is also present is not unreasonable thing though. She is very concerned and has a lot of anxiety about either recurrence of this breast or any other breast. Additionally she has another family member who has been diagnosed with breast cancer. We have long discussion today both about a unilateral or bilateral mastectomies and the risks and benefits associated with those. We also discussed the goals of those operations as well as the difficulty with wound healing. We discussed possible reconstruction of plastic surgery referral. I told her that I did not think plastic surgery would reconstruct her although she continued to be smoking and she is right now. She is going to think about this conversation and I will talk to Dr. Myna Hidalgo about her as well. I will plan on seeing her back in a couple weeks.

## 2012-05-06 ENCOUNTER — Other Ambulatory Visit: Payer: Self-pay | Admitting: *Deleted

## 2012-05-06 DIAGNOSIS — F419 Anxiety disorder, unspecified: Secondary | ICD-10-CM

## 2012-05-06 DIAGNOSIS — N644 Mastodynia: Secondary | ICD-10-CM

## 2012-05-06 DIAGNOSIS — I82A21 Chronic embolism and thrombosis of right axillary vein: Secondary | ICD-10-CM

## 2012-05-06 MED ORDER — ALPRAZOLAM 1 MG PO TABS: 1.0000 mg | ORAL_TABLET | Freq: Three times a day (TID) | ORAL | Status: DC | PRN

## 2012-05-06 MED ORDER — OXYCODONE HCL 15 MG PO TABS: ORAL_TABLET | ORAL | Status: DC

## 2012-05-06 NOTE — Telephone Encounter (Signed)
Pt called yesterday afternoon stating "I will be in around 11am tomorrow pick up my scripts". After reviewing her chart, she is due for her 2 week refill. Will route to Dr Myna Hidalgo for approval & signature.

## 2012-05-20 ENCOUNTER — Other Ambulatory Visit: Payer: Self-pay | Admitting: *Deleted

## 2012-05-20 ENCOUNTER — Encounter (INDEPENDENT_AMBULATORY_CARE_PROVIDER_SITE_OTHER): Payer: Self-pay | Admitting: General Surgery

## 2012-05-20 ENCOUNTER — Ambulatory Visit (INDEPENDENT_AMBULATORY_CARE_PROVIDER_SITE_OTHER): Payer: Self-pay | Admitting: General Surgery

## 2012-05-20 VITALS — BP 154/76 | HR 80 | Temp 97.6°F | Resp 24 | Ht 64.0 in | Wt 238.4 lb

## 2012-05-20 DIAGNOSIS — F329 Major depressive disorder, single episode, unspecified: Secondary | ICD-10-CM

## 2012-05-20 DIAGNOSIS — C50919 Malignant neoplasm of unspecified site of unspecified female breast: Secondary | ICD-10-CM

## 2012-05-20 DIAGNOSIS — N644 Mastodynia: Secondary | ICD-10-CM

## 2012-05-20 DIAGNOSIS — I82A21 Chronic embolism and thrombosis of right axillary vein: Secondary | ICD-10-CM

## 2012-05-20 MED ORDER — ALPRAZOLAM 1 MG PO TABS: 1.0000 mg | ORAL_TABLET | Freq: Three times a day (TID) | ORAL | Status: DC | PRN

## 2012-05-20 MED ORDER — OXYCODONE HCL 15 MG PO TABS: ORAL_TABLET | ORAL | Status: DC

## 2012-05-20 NOTE — Telephone Encounter (Signed)
Pt called Wednesday stating "I will be in Friday to pick up my scripts". After reviewing her chart, she is due for her 2 week refill. Will route to Dr Myna Hidalgo for approval & signature.

## 2012-05-20 NOTE — Progress Notes (Signed)
Patient ID: [name], female   DOB: 1966/05/15, 46 y.o.   MRN: [id number]  Chief Complaint  Patient presents with  . Follow-up    reck Br & discuss Sx    HPI [name] is a 46 y.o. female.   HPI 48 yof who has undergone a left breast lumpectomy, sentinel node biopsy and radiation therapy for left breast cancer. She has had a lot of issues with "leathery" skin on the left breast and soreness that have been worsening over some time. She has a nl mri done earlier this year due to concerns over her exam. She is due for her mmg in January 2014. Her last mammogram shows a benign skin calcifications as well as some coarse curvilinear calcifications consistent with fat necrosis in her axilla. There are also some benign calcifications in the lumpectomy site. An ultrasound of another area that she had before showed likely fatty necrosis. Her genetic testing previously all been negative. I also done a punch biopsy in the region of the skin changes that showed consistent with radiation changes. She is concerned about the soreness as well as her appearance. She also is very concerned about her cancer risk moving forward.  She also states she she has had one episode of non-bloody discharge from her left nipple recently. She came back in today after thinking about options and very much just wants to proceed with bilateral total mastectomy.  She reports no other changes since last visit.   Past Medical History  Diagnosis Date  . Arthritis   . Asthma   . Chronic kidney disease   . Hypertension   . COPD (chronic obstructive pulmonary disease)   . GERD (gastroesophageal reflux disease)   . Breast pain in female 07/03/2011  . Cancer     kidney and breast  . Breast CA 07/03/2011    left  . Kidney carcinoma 07/03/2011    right    Past Surgical History  Procedure Date  . Bladder suspension 2012  . Bladder surgery 2012    bladder stretched  . Kidney surgery 2011    right kidney cancer 1/2  kidney removed   . Breast surgery 2008-2009    left breast lymph nodes   . Breast surgery 2008-2009    left breast lumpectomy   . Cholecystectomy 1985  . Foot surgery     right  . Abdominal hysterectomy     partial  . Oophorectomy     left  . Cesarean section   . Nasal sinus surgery   . Bunionectomy     Family History  Problem Relation Age of Onset  . Kidney disease Father   . Cancer Mother     breast  . Cancer Paternal Aunt     unaware of 1, and 2nd had breast    Social History History  Substance Use Topics  . Smoking status: Current Every Day Smoker -- 0.3 packs/day  . Smokeless tobacco: Never Used  . Alcohol Use: No    Allergies  Allergen Reactions  . Darvocet (Propoxyphene-Acetaminophen)     Nausea, hives, and itching    . Doxycycline   . Morphine And Related   . Naproxen   . Nsaids   . Penicillins     Throat swells, and hives   . Sulfur     Throat swells and hives    . Ultracet (Tramadol-Acetaminophen)     Nausea, hives, itching    . Ultram (Tramadol Hcl)  Nausea, hives, itching     Current Outpatient Prescriptions  Medication Sig Dispense Refill  . ALPRAZolam (XANAX) 1 MG tablet Take 1 tablet (1 mg total) by mouth 3 (three) times daily as needed for anxiety.  45 tablet  0  . AmLODIPine Besylate (NORVASC PO) Take 20 mg by mouth every morning.       . IBUPROFEN PO Take 200 mg by mouth as needed.       . lansoprazole (PREVACID) 30 MG capsule Take 20 mg by mouth 2 (two) times daily.       Marland Kitchen lisinopril (PRINIVIL,ZESTRIL) 10 MG tablet Take 10 mg by mouth daily.        Marland Kitchen oxyCODONE (ROXICODONE) 15 MG immediate release tablet Take 1, IF NEEDED, every 8 hours for pain  45 tablet  0  . Toremifene Citrate (FARESTON) 60 MG tablet Take 60 mg by mouth daily.        Marland Kitchen DISCONTD: ALPRAZolam (XANAX) 1 MG tablet Take 1 tablet (1 mg total) by mouth 3 (three) times daily as needed for anxiety.  45 tablet  0    Review of Systems Review of Systems  Blood  pressure 154/76, pulse 80, temperature 97.6 F (36.4 C), temperature source Temporal, resp. rate 24, height 5\' 4"  (1.626 m), weight 238 lb 6.4 oz (108.138 kg).  Physical Exam Physical Exam  Vitals reviewed. Constitutional: She appears well-developed and well-nourished.  Cardiovascular: Normal rate, regular rhythm and normal heart sounds.   Pulmonary/Chest: Breath sounds normal. She has no wheezes. She has no rales. Right breast exhibits no inverted nipple, no mass, no nipple discharge, no skin change and no tenderness. Left breast exhibits skin change (hard leathery skin) and tenderness. Left breast exhibits no inverted nipple and no mass (it is difficult to tell if she has a mass due to her shrunken hard breast and significant skin chagnes).  Lymphadenopathy:    She has no cervical adenopathy.     Assessment    History breast cancer on left s/p lump/sn/xrt Family history of breast cancer    Plan    I think it is reasonable to do this although she is high risk for smoking/radiation therapy.  She has significantly reduced smoking and we will do several weeks from now. I will also try to do mmg earlier than January to make sure no surprises at surgery.   I told her she is not candidate for reconstruction at this time but if she stopped smoking completely we could wait and possibly do this with plastic surgery.  That would be my preference on the left side but she does not want to do this. We will plan bilateral total mastectomies.  Risks include bleeding, infection, wound disruption requiring dressing changes and possible further procedures, as well as medical risks.  I think the left side will be difficult and I expect there will be wound problems.  She understands this.  We discussed this does not reduce her risk of breast cancer to 0% and she still is at risk even after mastectomy.  I think right now it is so difficult to examine the left side and I am concerned about it that it is  reasonable to go ahead with this now.  I told her this may not relieve any of her pain issues she has now and may even make them worse.          Neta Upadhyay 05/20/2012, 11:04 AM

## 2012-06-03 ENCOUNTER — Other Ambulatory Visit: Payer: Self-pay | Admitting: *Deleted

## 2012-06-03 DIAGNOSIS — F419 Anxiety disorder, unspecified: Secondary | ICD-10-CM

## 2012-06-03 DIAGNOSIS — I82A21 Chronic embolism and thrombosis of right axillary vein: Secondary | ICD-10-CM

## 2012-06-03 DIAGNOSIS — N644 Mastodynia: Secondary | ICD-10-CM

## 2012-06-03 MED ORDER — ALPRAZOLAM 1 MG PO TABS: 1.0000 mg | ORAL_TABLET | Freq: Three times a day (TID) | ORAL | Status: DC | PRN

## 2012-06-03 MED ORDER — OXYCODONE HCL 15 MG PO TABS: ORAL_TABLET | ORAL | Status: DC

## 2012-06-03 NOTE — Telephone Encounter (Signed)
Pt called yesterday asking to pick up her Xanax and Oxy-IR prescriptions today. After reviewing her chart, she is due for her 2 week refill. Will route to Dr Ennever for approval & signature. 

## 2012-06-17 ENCOUNTER — Other Ambulatory Visit: Payer: Self-pay | Admitting: *Deleted

## 2012-06-17 DIAGNOSIS — N644 Mastodynia: Secondary | ICD-10-CM

## 2012-06-17 DIAGNOSIS — I82A21 Chronic embolism and thrombosis of right axillary vein: Secondary | ICD-10-CM

## 2012-06-17 DIAGNOSIS — F329 Major depressive disorder, single episode, unspecified: Secondary | ICD-10-CM

## 2012-06-17 MED ORDER — ALPRAZOLAM 1 MG PO TABS: 1.0000 mg | ORAL_TABLET | Freq: Three times a day (TID) | ORAL | Status: DC | PRN

## 2012-06-17 MED ORDER — OXYCODONE HCL 15 MG PO TABS: ORAL_TABLET | ORAL | Status: DC

## 2012-06-17 NOTE — Telephone Encounter (Signed)
Pt called telling Tresa Endo at the front desk "I will be in 30 minutes to pick up my scripts". After reviewing her chart, she is due for her 2 week refill. Will route to Dr Myna Hidalgo for approval & signature.

## 2012-06-20 ENCOUNTER — Encounter (HOSPITAL_COMMUNITY): Payer: Self-pay | Admitting: Pharmacy Technician

## 2012-06-22 ENCOUNTER — Ambulatory Visit (HOSPITAL_BASED_OUTPATIENT_CLINIC_OR_DEPARTMENT_OTHER): Payer: Self-pay | Admitting: Hematology & Oncology

## 2012-06-22 ENCOUNTER — Other Ambulatory Visit (HOSPITAL_BASED_OUTPATIENT_CLINIC_OR_DEPARTMENT_OTHER): Payer: Self-pay | Admitting: Lab

## 2012-06-22 VITALS — BP 141/81 | HR 69 | Temp 98.1°F | Resp 18 | Ht 64.0 in | Wt 244.0 lb

## 2012-06-22 DIAGNOSIS — N644 Mastodynia: Secondary | ICD-10-CM

## 2012-06-22 DIAGNOSIS — C50919 Malignant neoplasm of unspecified site of unspecified female breast: Secondary | ICD-10-CM

## 2012-06-22 DIAGNOSIS — I82A21 Chronic embolism and thrombosis of right axillary vein: Secondary | ICD-10-CM

## 2012-06-22 DIAGNOSIS — R21 Rash and other nonspecific skin eruption: Secondary | ICD-10-CM

## 2012-06-22 DIAGNOSIS — C649 Malignant neoplasm of unspecified kidney, except renal pelvis: Secondary | ICD-10-CM

## 2012-06-22 LAB — COMPREHENSIVE METABOLIC PANEL
Albumin: 3.9 g/dL (ref 3.5–5.2)
BUN: 5 mg/dL — ABNORMAL LOW (ref 6–23)
Calcium: 9 mg/dL (ref 8.4–10.5)
Chloride: 104 mEq/L (ref 96–112)
Creatinine, Ser: 0.54 mg/dL (ref 0.50–1.10)
Glucose, Bld: 100 mg/dL — ABNORMAL HIGH (ref 70–99)
Potassium: 3.9 mEq/L (ref 3.5–5.3)

## 2012-06-22 LAB — CBC WITH DIFFERENTIAL (CANCER CENTER ONLY)
BASO#: 0 10*3/uL (ref 0.0–0.2)
Eosinophils Absolute: 0.2 10*3/uL (ref 0.0–0.5)
HCT: 40.4 % (ref 34.8–46.6)
HGB: 14.1 g/dL (ref 11.6–15.9)
LYMPH#: 1.8 10*3/uL (ref 0.9–3.3)
MONO#: 0.6 10*3/uL (ref 0.1–0.9)
NEUT#: 6 10*3/uL (ref 1.5–6.5)
NEUT%: 70.3 % (ref 39.6–80.0)
RBC: 4.36 10*6/uL (ref 3.70–5.32)

## 2012-06-22 NOTE — Progress Notes (Signed)
This office note has been dictated.

## 2012-06-22 NOTE — Patient Instructions (Addendum)
20 CIANI RUTTEN  06/22/2012   Your procedure is scheduled on: 06/28/12  Report to [facility] at 05:15AM.  Call this number if you have problems the morning of surgery [phone]: [phone]   Remember:   Do not eat food or drink liquids After Midnight.     Take these medicines the morning of surgery with A SIP OF WATER: amlodipine, prevacid   Do not wear jewelry, make-up or nail polish.  Do not wear lotions, powders, or perfumes. You may wear deodorant.  Do not shave 48 hours prior to surgery. Men may shave face and neck.  Do not bring valuables to the hospital.  Contacts, dentures or bridgework may not be worn into surgery.  Leave suitcase in the car. After surgery it may be brought to your room.  For patients admitted to the hospital, checkout time is 11:00 AM the day of discharge.    Special Instructions: Shower using CHG 2 nights before surgery and the night before surgery.  If you shower the day of surgery use CHG.  Use special wash - you have one bottle of CHG for all showers.  You should use approximately 1/3 of the bottle for each shower.   Please read over the following fact sheets that you were given: MRSA Information.  Birdie Sons, RN  pre op nurse call if needed [phone]    FAILURE TO FOLLOW THESE INSTRUCTIONS MAY RESULT IN CANCELLATION OF YOUR SURGERY   Patient Signature: ___________________________________________

## 2012-06-23 ENCOUNTER — Ambulatory Visit (HOSPITAL_COMMUNITY)
Admission: RE | Admit: 2012-06-23 | Discharge: 2012-06-23 | Disposition: A | Discharge status: Home / Self Care | Payer: Self-pay | Source: Ambulatory Visit | Attending: General Surgery | Admitting: General Surgery

## 2012-06-23 ENCOUNTER — Encounter (HOSPITAL_COMMUNITY)
Admission: RE | Admit: 2012-06-23 | Discharge: 2012-06-23 | Disposition: A | Discharge status: Home / Self Care | Payer: Self-pay | Source: Ambulatory Visit | Attending: General Surgery | Admitting: General Surgery

## 2012-06-23 ENCOUNTER — Encounter (HOSPITAL_COMMUNITY): Payer: Self-pay

## 2012-06-23 DIAGNOSIS — Z01818 Encounter for other preprocedural examination: Secondary | ICD-10-CM | POA: Insufficient documentation

## 2012-06-23 DIAGNOSIS — Z01812 Encounter for preprocedural laboratory examination: Secondary | ICD-10-CM | POA: Insufficient documentation

## 2012-06-23 DIAGNOSIS — C50919 Malignant neoplasm of unspecified site of unspecified female breast: Secondary | ICD-10-CM | POA: Insufficient documentation

## 2012-06-23 HISTORY — DX: Enthesopathy, unspecified: M77.9

## 2012-06-23 HISTORY — DX: Other specified postprocedural states: Z98.890

## 2012-06-23 HISTORY — DX: Anxiety disorder, unspecified: F41.9

## 2012-06-23 HISTORY — DX: Nausea with vomiting, unspecified: R11.2

## 2012-06-23 HISTORY — DX: Unspecified convulsions: R56.9

## 2012-06-23 LAB — CBC WITH DIFFERENTIAL/PLATELET
Basophils Relative: 0 % (ref 0–1)
Eosinophils Absolute: 0.4 10*3/uL (ref 0.0–0.7)
Hemoglobin: 13.4 g/dL (ref 12.0–15.0)
MCH: 32.1 pg (ref 26.0–34.0)
MCHC: 34.7 g/dL (ref 30.0–36.0)
Monocytes Relative: 7 % (ref 3–12)
Neutrophils Relative %: 66 % (ref 43–77)
Platelets: 243 10*3/uL (ref 150–400)

## 2012-06-23 LAB — BASIC METABOLIC PANEL
BUN: 10 mg/dL (ref 6–23)
Calcium: 8.6 mg/dL (ref 8.4–10.5)
GFR calc non Af Amer: >90 mL/min (ref >90)
Glucose, Bld: 127 mg/dL — ABNORMAL HIGH (ref 70–99)

## 2012-06-23 LAB — SURGICAL PCR SCREEN: Staphylococcus aureus: POSITIVE — AB

## 2012-06-23 NOTE — Progress Notes (Signed)
06/23/12 0857  OBSTRUCTIVE SLEEP APNEA  Have you ever been diagnosed with sleep apnea through a sleep study? No  Do you snore loudly (loud enough to be heard through closed doors)?  1  Do you often feel tired, fatigued, or sleepy during the daytime? 0  Has anyone observed you stop breathing during your sleep? 1  Do you have, or are you being treated for high blood pressure? 1  BMI more than 35 kg/m2? 1  Age over 47 years old? 0  Neck circumference greater than 40 cm/18 inches? 0  Gender: 0  Obstructive Sleep Apnea Score 4   Score 4 or greater  Results sent to PCP

## 2012-06-23 NOTE — Progress Notes (Signed)
CC:   Carolyn Gosling, MD  DIAGNOSES: 1. 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 2. Chronic breast causalgia. 3. 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  CURRENT THERAPY:  Percocet for breast pain.  INTERIM HISTORY:  [name] comes in for followup.  Her skin rash seems to be a little bit better.  We cannot get her to a dermatologist as she cannot afford the up-front payment that is typically needed.  The big news with [name] is that she is going to have mastectomies. She saw Dr. Dwain Sarna.  She stopped smoking.  He will do bilateral mastectomy on her on December 10th.  Her left breast has been a huge source of pain for her.  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  She is worried about having the mastectomies.  I told her that she will do fine and that the fact that she stopped smoking will certainly help her out.  This skin rash that she has still is there, but not as bad.  I am sure that she will have biopsies done during her procedure.  Things are doing better at home.  She and her boyfriend are by themselves.  They are not bothered with their kids or grand kids, which she is thankful for.  She has had no problems with her bowels or bladder.  She does urinate quite a bit.  She does go out to [facility] for her kidney and bladder issues.  They apparently want to do another surgery for her bladder, but she does not want to have a third surgery for this.  PHYSICAL EXAMINATION:  General:  This is a fairly well-developed, well- nourished white female in no obvious distress.  Vital signs:  98, pulse 69, respiratory rate 18, blood pressure is 144/81.  Weight is 244.  Head and neck:  Normocephalic, atraumatic skull.  There are no ocular or oral lesions.  There is no scleral icterus.  There is no adenopathy in the neck.  Lungs:  Clear bilaterally.   Cardiac:  Regular rate and rhythm with a normal S1 and S2.  There are no murmurs, rubs, or bruits. Breasts:  Left breast is an extremely firm and contracted left breast. It is very tender to touch.  There is tenderness in the left axilla. Right breast is without masses.  Right breast is without firmness. There is still some slight tenderness to palpation.  No distinct mass is noted in the right breast.  There is no right axillary adenopathy. Abdomen:  Right abdominal ventral hernia.  This is where she had her nephrectomy.  This is reducible, but tender.  No fluid wave is noted in the abdomen.  There is no palpable hepatosplenomegaly.  Back:  No tenderness over the spine, ribs, or hips.  Extremities:  No clubbing, cyanosis, or edema.  Skin:  Does show this maculopapular rash throughout her skin.  This is not as raised or erythematous as when I last saw it. Neurological:  No focal neurological deficits.  LABORATORY STUDIES:  White cell count is 8.6, hemoglobin 14.1, hematocrit 40.4, platelet count is 245.  IMPRESSION:  [name] is a 46 year old white female.  She had her lumpectomy about 4 years ago.  We had her on Fareston.  She really could not afford Fareston or anything else.  As such, she has really not been taking  anything for her breast cancer.  She had a very low archetype score (7), so her risk of recurrence I think is very low in the first place.  I really hope that this mastectomy that she will have will help with her chronic pain.  I am certainly praying hard about this.  I still suspect that she will need her pain medications for a while.  It is going to be I think several months before we can probably start tapering her down from the pain medication.  When she has the mastectomies, then I think the next surgery is going to be this abdominal wall hernia.  I will plan to see her back myself in about 2 months.  By then, she should have recovered nicely from her  mastectomies.    ______________________________ Josph Macho, M.D. PRE/MEDQ  D:  06/22/2012  T:  06/23/2012  Job:  [id number]

## 2012-06-28 ENCOUNTER — Encounter (HOSPITAL_COMMUNITY): Payer: Self-pay

## 2012-06-28 ENCOUNTER — Encounter (HOSPITAL_COMMUNITY)
Admission: RE | Disposition: A | Discharge status: Home / Self Care | Payer: Self-pay | Source: Ambulatory Visit | Attending: General Surgery

## 2012-06-28 ENCOUNTER — Inpatient Hospital Stay (HOSPITAL_COMMUNITY)
Admission: RE | Admit: 2012-06-28 | Discharge: 2012-07-01 | DRG: 582 | Disposition: A | Discharge status: Home / Self Care | Payer: Self-pay | Source: Ambulatory Visit | Attending: General Surgery | Admitting: General Surgery

## 2012-06-28 ENCOUNTER — Encounter (HOSPITAL_COMMUNITY): Payer: Self-pay | Admitting: Anesthesiology

## 2012-06-28 ENCOUNTER — Ambulatory Visit (HOSPITAL_COMMUNITY): Payer: Self-pay | Admitting: Anesthesiology

## 2012-06-28 DIAGNOSIS — C649 Malignant neoplasm of unspecified kidney, except renal pelvis: Secondary | ICD-10-CM

## 2012-06-28 DIAGNOSIS — N6089 Other benign mammary dysplasias of unspecified breast: Secondary | ICD-10-CM

## 2012-06-28 DIAGNOSIS — Z923 Personal history of irradiation: Secondary | ICD-10-CM

## 2012-06-28 DIAGNOSIS — Z880 Allergy status to penicillin: Secondary | ICD-10-CM

## 2012-06-28 DIAGNOSIS — N6019 Diffuse cystic mastopathy of unspecified breast: Secondary | ICD-10-CM

## 2012-06-28 DIAGNOSIS — J449 Chronic obstructive pulmonary disease, unspecified: Secondary | ICD-10-CM | POA: Diagnosis present

## 2012-06-28 DIAGNOSIS — I10 Essential (primary) hypertension: Secondary | ICD-10-CM | POA: Diagnosis present

## 2012-06-28 DIAGNOSIS — F17200 Nicotine dependence, unspecified, uncomplicated: Secondary | ICD-10-CM | POA: Diagnosis present

## 2012-06-28 DIAGNOSIS — Z79899 Other long term (current) drug therapy: Secondary | ICD-10-CM

## 2012-06-28 DIAGNOSIS — C50919 Malignant neoplasm of unspecified site of unspecified female breast: Principal | ICD-10-CM | POA: Diagnosis present

## 2012-06-28 DIAGNOSIS — I82A21 Chronic embolism and thrombosis of right axillary vein: Secondary | ICD-10-CM

## 2012-06-28 DIAGNOSIS — N644 Mastodynia: Secondary | ICD-10-CM

## 2012-06-28 DIAGNOSIS — K219 Gastro-esophageal reflux disease without esophagitis: Secondary | ICD-10-CM | POA: Diagnosis present

## 2012-06-28 DIAGNOSIS — J4489 Other specified chronic obstructive pulmonary disease: Secondary | ICD-10-CM | POA: Diagnosis present

## 2012-06-28 DIAGNOSIS — F419 Anxiety disorder, unspecified: Secondary | ICD-10-CM

## 2012-06-28 DIAGNOSIS — D249 Benign neoplasm of unspecified breast: Secondary | ICD-10-CM

## 2012-06-28 DIAGNOSIS — Z6841 Body Mass Index (BMI) 40.0 and over, adult: Secondary | ICD-10-CM

## 2012-06-28 HISTORY — PX: TOTAL MASTECTOMY: SHX6129

## 2012-06-28 SURGERY — MASTECTOMY, SIMPLE
Anesthesia: General | Site: Breast | Laterality: Bilateral | Wound class: Clean

## 2012-06-28 MED ORDER — HYDROMORPHONE 0.3 MG/ML IV SOLN
INTRAVENOUS | Status: AC
  Filled 2012-06-28: qty 25

## 2012-06-28 MED ORDER — METOCLOPRAMIDE HCL 5 MG/ML IJ SOLN
INTRAMUSCULAR | Status: AC
  Filled 2012-06-28: qty 2

## 2012-06-28 MED ORDER — DEXAMETHASONE SODIUM PHOSPHATE 10 MG/ML IJ SOLN
INTRAMUSCULAR | Status: DC | PRN
  Administered 2012-06-28: 10 mg via INTRAVENOUS

## 2012-06-28 MED ORDER — VANCOMYCIN HCL IN DEXTROSE 1-5 GM/200ML-% IV SOLN
INTRAVENOUS | Status: AC
  Filled 2012-06-28: qty 200

## 2012-06-28 MED ORDER — HYDROMORPHONE HCL PF 1 MG/ML IJ SOLN
0.5000 mg | Freq: Once | INTRAMUSCULAR | Status: AC
  Administered 2012-06-28: 0.5 mg via INTRAVENOUS

## 2012-06-28 MED ORDER — ONDANSETRON HCL 4 MG/2ML IJ SOLN: 4.0000 mg | Freq: Four times a day (QID) | INTRAMUSCULAR | Status: DC | PRN

## 2012-06-28 MED ORDER — HYDROMORPHONE HCL PF 1 MG/ML IJ SOLN
INTRAMUSCULAR | Status: AC
  Filled 2012-06-28: qty 1

## 2012-06-28 MED ORDER — SUCCINYLCHOLINE CHLORIDE 20 MG/ML IJ SOLN
INTRAMUSCULAR | Status: DC | PRN
  Administered 2012-06-28: 100 mg via INTRAVENOUS

## 2012-06-28 MED ORDER — ACETAMINOPHEN 10 MG/ML IV SOLN
INTRAVENOUS | Status: AC
  Filled 2012-06-28: qty 100

## 2012-06-28 MED ORDER — MIDAZOLAM HCL 5 MG/5ML IJ SOLN
INTRAMUSCULAR | Status: DC | PRN
  Administered 2012-06-28 (×2): 2 mg via INTRAVENOUS

## 2012-06-28 MED ORDER — ACETAMINOPHEN 10 MG/ML IV SOLN
INTRAVENOUS | Status: DC | PRN
  Administered 2012-06-28: 1000 mg via INTRAVENOUS

## 2012-06-28 MED ORDER — DIPHENHYDRAMINE HCL 50 MG/ML IJ SOLN: 12.5000 mg | Freq: Four times a day (QID) | INTRAMUSCULAR | Status: DC | PRN

## 2012-06-28 MED ORDER — PANTOPRAZOLE SODIUM 20 MG PO TBEC
20.0000 mg | DELAYED_RELEASE_TABLET | Freq: Every day | ORAL | Status: DC
  Administered 2012-06-28 – 2012-07-01 (×4): 20 mg via ORAL
  Filled 2012-06-28 (×4): qty 1

## 2012-06-28 MED ORDER — ALPRAZOLAM 1 MG PO TABS
1.0000 mg | ORAL_TABLET | Freq: Three times a day (TID) | ORAL | Status: DC | PRN
  Administered 2012-06-28 – 2012-07-01 (×9): 1 mg via ORAL
  Filled 2012-06-28 (×9): qty 1

## 2012-06-28 MED ORDER — AMLODIPINE BESYLATE 10 MG PO TABS
20.0000 mg | ORAL_TABLET | Freq: Every day | ORAL | Status: DC
  Administered 2012-06-28 – 2012-07-01 (×4): 20 mg via ORAL
  Filled 2012-06-28 (×4): qty 2

## 2012-06-28 MED ORDER — NALOXONE HCL 0.4 MG/ML IJ SOLN: 0.4000 mg | INTRAMUSCULAR | Status: DC | PRN

## 2012-06-28 MED ORDER — FENTANYL CITRATE 0.05 MG/ML IJ SOLN
INTRAMUSCULAR | Status: DC | PRN
  Administered 2012-06-28: 50 ug via INTRAVENOUS
  Administered 2012-06-28: 100 ug via INTRAVENOUS
  Administered 2012-06-28 (×2): 50 ug via INTRAVENOUS
  Administered 2012-06-28 (×2): 100 ug via INTRAVENOUS

## 2012-06-28 MED ORDER — LISINOPRIL 10 MG PO TABS
10.0000 mg | ORAL_TABLET | Freq: Every day | ORAL | Status: DC
  Administered 2012-06-28 – 2012-07-01 (×4): 10 mg via ORAL
  Filled 2012-06-28 (×4): qty 1

## 2012-06-28 MED ORDER — 0.9 % SODIUM CHLORIDE (POUR BTL) OPTIME
TOPICAL | Status: DC | PRN
  Administered 2012-06-28: 1000 mL

## 2012-06-28 MED ORDER — PROMETHAZINE HCL 25 MG/ML IJ SOLN
INTRAMUSCULAR | Status: AC
  Filled 2012-06-28: qty 1

## 2012-06-28 MED ORDER — HYDROMORPHONE 0.3 MG/ML IV SOLN
INTRAVENOUS | Status: DC
  Administered 2012-06-28: 3.39 mg via INTRAVENOUS
  Administered 2012-06-28: 11:00:00 via INTRAVENOUS
  Administered 2012-06-28: 3.39 mg via INTRAVENOUS
  Administered 2012-06-28: 22:00:00 via INTRAVENOUS
  Administered 2012-06-29: 2.19 mg via INTRAVENOUS
  Administered 2012-06-29: 2.99 mg via INTRAVENOUS
  Administered 2012-06-29: 2.39 mg via INTRAVENOUS
  Filled 2012-06-28 (×2): qty 25

## 2012-06-28 MED ORDER — NEOSTIGMINE METHYLSULFATE 1 MG/ML IJ SOLN
INTRAMUSCULAR | Status: DC | PRN
  Administered 2012-06-28: 5 mg via INTRAVENOUS

## 2012-06-28 MED ORDER — SODIUM CHLORIDE 0.9 % IV SOLN
INTRAVENOUS | Status: DC
  Administered 2012-06-28 – 2012-06-29 (×2): via INTRAVENOUS

## 2012-06-28 MED ORDER — ROCURONIUM BROMIDE 100 MG/10ML IV SOLN
INTRAVENOUS | Status: DC | PRN
  Administered 2012-06-28 (×2): 10 mg via INTRAVENOUS
  Administered 2012-06-28: 30 mg via INTRAVENOUS

## 2012-06-28 MED ORDER — ONDANSETRON HCL 4 MG/2ML IJ SOLN
INTRAMUSCULAR | Status: DC | PRN
  Administered 2012-06-28: 4 mg via INTRAVENOUS

## 2012-06-28 MED ORDER — LIDOCAINE HCL (CARDIAC) 20 MG/ML IV SOLN
INTRAVENOUS | Status: DC | PRN
  Administered 2012-06-28: 100 mg via INTRAVENOUS

## 2012-06-28 MED ORDER — PROPOFOL 10 MG/ML IV BOLUS
INTRAVENOUS | Status: DC | PRN
  Administered 2012-06-28: 200 mg via INTRAVENOUS

## 2012-06-28 MED ORDER — SCOPOLAMINE 1 MG/3DAYS TD PT72
MEDICATED_PATCH | TRANSDERMAL | Status: AC
  Filled 2012-06-28: qty 1

## 2012-06-28 MED ORDER — HYDROMORPHONE HCL PF 1 MG/ML IJ SOLN
0.2500 mg | INTRAMUSCULAR | Status: DC | PRN
  Administered 2012-06-28 (×3): 0.5 mg via INTRAVENOUS

## 2012-06-28 MED ORDER — GLYCOPYRROLATE 0.2 MG/ML IJ SOLN
INTRAMUSCULAR | Status: DC | PRN
  Administered 2012-06-28: 0.6 mg via INTRAVENOUS

## 2012-06-28 MED ORDER — DIPHENHYDRAMINE HCL 12.5 MG/5ML PO ELIX: 12.5000 mg | ORAL_SOLUTION | Freq: Four times a day (QID) | ORAL | Status: DC | PRN

## 2012-06-28 MED ORDER — INFLUENZA VIRUS VACC SPLIT PF IM SUSP
0.5000 mL | INTRAMUSCULAR | Status: AC
  Administered 2012-06-29: 0.5 mL via INTRAMUSCULAR
  Filled 2012-06-28: qty 0.5

## 2012-06-28 MED ORDER — VANCOMYCIN HCL IN DEXTROSE 1-5 GM/200ML-% IV SOLN
1000.0000 mg | INTRAVENOUS | Status: AC
  Administered 2012-06-28: 1000 mg via INTRAVENOUS

## 2012-06-28 MED ORDER — HYDROMORPHONE HCL PF 1 MG/ML IJ SOLN
INTRAMUSCULAR | Status: DC | PRN
  Administered 2012-06-28 (×2): 1 mg via INTRAVENOUS

## 2012-06-28 MED ORDER — SCOPOLAMINE 1 MG/3DAYS TD PT72
MEDICATED_PATCH | TRANSDERMAL | Status: DC | PRN
  Administered 2012-06-28: 1 via TRANSDERMAL

## 2012-06-28 MED ORDER — LACTATED RINGERS IV SOLN
INTRAVENOUS | Status: DC | PRN
  Administered 2012-06-28 (×3): via INTRAVENOUS
  Administered 2012-06-28: 10:00:00

## 2012-06-28 MED ORDER — PROMETHAZINE HCL 25 MG/ML IJ SOLN
6.2500 mg | INTRAMUSCULAR | Status: AC | PRN
  Administered 2012-06-28 (×2): 12.5 mg via INTRAVENOUS

## 2012-06-28 MED ORDER — SODIUM CHLORIDE 0.9 % IJ SOLN: 9.0000 mL | INTRAMUSCULAR | Status: DC | PRN

## 2012-06-28 MED ORDER — METOCLOPRAMIDE HCL 5 MG/ML IJ SOLN
10.0000 mg | Freq: Once | INTRAMUSCULAR | Status: AC | PRN
  Administered 2012-06-28: 10 mg via INTRAVENOUS

## 2012-06-28 SURGICAL SUPPLY — 30 items
ADH SKN CLS APL DERMABOND .7 (GAUZE/BANDAGES/DRESSINGS) ×4
BINDER BREAST LRG (GAUZE/BANDAGES/DRESSINGS) IMPLANT
BINDER BREAST XLRG (GAUZE/BANDAGES/DRESSINGS) ×4 IMPLANT
CHLORAPREP W/TINT 26ML (MISCELLANEOUS) ×2 IMPLANT
CLOSURE STERI-STRIP 1/4X4 (GAUZE/BANDAGES/DRESSINGS) IMPLANT
CLOTH BEACON ORANGE TIMEOUT ST (SAFETY) ×2 IMPLANT
CLSR STERI-STRIP ANTIMIC 1/2X4 (GAUZE/BANDAGES/DRESSINGS) ×6 IMPLANT
COVER SURGICAL LIGHT HANDLE (MISCELLANEOUS) ×2 IMPLANT
DERMABOND ADVANCED (GAUZE/BANDAGES/DRESSINGS) ×4
DERMABOND ADVANCED .7 DNX12 (GAUZE/BANDAGES/DRESSINGS) ×4 IMPLANT
DRAIN CHANNEL 19F RND (DRAIN) ×4 IMPLANT
DRAPE LAPAROSCOPIC ABDOMINAL (DRAPES) ×2 IMPLANT
DRSG PAD ABDOMINAL 8X10 ST (GAUZE/BANDAGES/DRESSINGS) ×2 IMPLANT
ELECT REM PT RETURN 9FT ADLT (ELECTROSURGICAL) ×2
ELECTRODE REM PT RTRN 9FT ADLT (ELECTROSURGICAL) ×1 IMPLANT
EVACUATOR SILICONE 100CC (DRAIN) ×4 IMPLANT
GLOVE BIO SURGEON STRL SZ7 (GLOVE) ×2 IMPLANT
GLOVE BIOGEL PI IND STRL 7.5 (GLOVE) ×1 IMPLANT
GLOVE BIOGEL PI INDICATOR 7.5 (GLOVE) ×1
GOWN STRL NON-REIN LRG LVL3 (GOWN DISPOSABLE) ×6 IMPLANT
KIT BASIN OR (CUSTOM PROCEDURE TRAY) ×2 IMPLANT
NS IRRIG 1000ML POUR BTL (IV SOLUTION) ×2 IMPLANT
PACK GENERAL/GYN (CUSTOM PROCEDURE TRAY) ×2 IMPLANT
SPONGE DRAIN TRACH 4X4 STRL 2S (GAUZE/BANDAGES/DRESSINGS) ×2 IMPLANT
SPONGE GAUZE 4X4 12PLY (GAUZE/BANDAGES/DRESSINGS) IMPLANT
SPONGE LAP 18X18 X RAY DECT (DISPOSABLE) ×8 IMPLANT
SUT ETHILON 2 0 PS N (SUTURE) ×4 IMPLANT
SUT MON AB 4-0 PC3 18 (SUTURE) ×4 IMPLANT
SUT VIC AB 3-0 SH 18 (SUTURE) ×10 IMPLANT
TOWEL OR 17X26 10 PK STRL BLUE (TOWEL DISPOSABLE) ×2 IMPLANT

## 2012-06-28 NOTE — Transfer of Care (Signed)
Immediate Anesthesia Transfer of Care Note  Patient: [name]  Procedure(s) Performed: Procedure(s) (LRB) with comments: TOTAL MASTECTOMY (Bilateral)  Patient Location: PACU  Anesthesia Type:General  Level of Consciousness: sedated  Airway & Oxygen Therapy: Patient Spontanous Breathing and Patient connected to face mask oxygen  Post-op Assessment: Report given to PACU RN and Post -op Vital signs reviewed and stable  Post vital signs: Reviewed and stable  Complications: No apparent anesthesia complications

## 2012-06-28 NOTE — Op Note (Signed)
Preoperative diagnosis: History of left breast cancer status post lumpectomy, sentinel lymph node biopsy, radiation therapy Postoperative diagnosis: Same as above Procedure: Bilateral total mastectomies Surgeon: Dr. Dwain Sarna Anesthesia: Gen. Specimens: #1 left breast marked short stitch superior, long stitch lateral #2 right breast marked short stitch superior, long stitch lateral Drains: One 35 [place] Blake drain to either side Estimated blood loss: 100 cc Complications: None Sponge and needle count correct x2 at of operation Disposition to recovery in stable condition  Indications: This is a 46 year old female who previously has undergone genetic testing it was negative and was treated for left breast cancer. She's had a very difficult time after radiation with a very shrunken hard breast that is very difficult to examine on the left side. We evaluated this area with a mmg, MRI as well as a punch biopsy and clinical exams and there is no evidence of a cancer on this side. She is concerned however moving forward about her risk for cancer both on the side that is difficult to examine in the other side. We had along discussion about the options and she very much would like to undergo bilateral total mastectomy for prophylaxis. I told her that this would not guarantee her increase survival and certainly would not completely prevent the risk of breast cancer on either side. This also was made somewhat difficult by the fact that she smokes and has had radiation therapy on the left. I discussed with her that I would like to wait until she stopped smoking and consider doing the reconstruction with a plastic surgeon at the same time but she did not want to pursue this. We discussed the risks of wound issues on the left side in particular.  Procedure: After informed consent was obtained the patient was taken to the operating room. She was administered vancomycin due to her penicillin allergy. She had  sequential compression devices on her legs. She was  placed under general anesthesia without complication. A Foley catheter was placed. Her bilateral breasts and axilla were then prepped and draped in the standard sterile surgical fashion. A surgical timeout was performed.  I did the left side first. A made an elliptical incision through radiated skin that I knew would be able to put back together at the end. This was a very difficult mastectomy due to her scarring from her prior surgery as well as radiation therapy. I created a flap superiorly to the clavicle, medially to the sternum, inferiorly to the inframammary crease, and to the latissimus laterally. The breast was then removed some difficulty from the pectoralis muscle including the pectoralis fascia. I then transected this laterally and oversewed the lateral portion. I then removed the breast and marked as above. I then obtained hemostasis. A irrigated. I then placed a 39 [place] Blake drain secured this with a 2-0 nylon. The strain cover the entire space. I then close this without any tension with multiple 3-0 Vicryl sutures. I then closed the skin with some difficulty due to the hardness with a 4 Monocryl. I then placed Dermabond and Steri-Strips overlying this. All this was viable upon completion. I then turned to the right side.  I performed a right-sided mastectomy in a similar fashion. I created an elliptical incision made flaps in the same way as the other side. This breast was then removed from the pectoralis muscle including the pectoralis fascia as well. I then placed a 19 [place] Blake drain. I secured this with 2-0 nylon. I then closes with 3-0 Vicryl  and 4 Monocryl. Steri-Strips and Dermabond were also placed. Dressings and a breast binder were placed. She tolerated this well was extubated and transferred to the recovery room in stable condition.

## 2012-06-28 NOTE — Interval H&P Note (Signed)
History and Physical Interval Note:  06/28/2012 7:32 AM  [name]  has presented today for surgery, with the diagnosis of breast cancer  The various methods of treatment have been discussed with the patient and family. After consideration of risks, benefits and other options for treatment, the patient has consented to  Procedure(s) (LRB) with comments: TOTAL MASTECTOMY (Bilateral) as a surgical intervention .  The patient's history has been reviewed, patient examined, no change in status, stable for surgery.  I have reviewed the patient's chart and labs.  Questions were answered to the patient's satisfaction.     Antwion Carpenter

## 2012-06-28 NOTE — Anesthesia Postprocedure Evaluation (Signed)
  Anesthesia Post-op Note  Patient: [name]  Procedure(s) Performed: Procedure(s) (LRB): TOTAL MASTECTOMY (Bilateral)  Patient Location: PACU  Anesthesia Type: General  Level of Consciousness: awake and alert   Airway and Oxygen Therapy: Patient Spontanous Breathing  Post-op Pain: mild  Post-op Assessment: Post-op Vital signs reviewed, Patient's Cardiovascular Status Stable, Respiratory Function Stable, Patent Airway and No signs of Nausea or vomiting  Last Vitals:  Filed Vitals:   06/28/12 1005  BP: 139/88  Pulse: 88  Temp:   Resp: 19    Post-op Vital Signs: stable   Complications: No apparent anesthesia complications

## 2012-06-28 NOTE — Progress Notes (Signed)
Patient arrived to floor from PACU. Received bedside report from PACU nurse. Patient stable and comfortable and resting in the bed. Will continue to monitor throughout shift. Angelena Form, RN

## 2012-06-28 NOTE — Progress Notes (Signed)
C/o nausea this am. Feels like it is nerves

## 2012-06-28 NOTE — Anesthesia Preprocedure Evaluation (Signed)
Anesthesia Evaluation  Patient identified by MRN, date of birth, ID band Patient awake    Reviewed: Allergy & Precautions, H&P , NPO status , Patient's Chart, lab work & pertinent test results  Airway Mallampati: III TM Distance: <3 FB Neck ROM: Full    Dental No notable dental hx.    Pulmonary asthma , COPD breath sounds clear to auscultation  Pulmonary exam normal       Cardiovascular hypertension, Pt. on medications Rhythm:Regular Rate:Normal     Neuro/Psych negative neurological ROS  negative psych ROS   GI/Hepatic negative GI ROS, Neg liver ROS, GERD-  ,  Endo/Other  Morbid obesity  Renal/GU negative Renal ROS  negative genitourinary   Musculoskeletal negative musculoskeletal ROS (+)   Abdominal   Peds negative pediatric ROS (+)  Hematology negative hematology ROS (+)   Anesthesia Other Findings   Reproductive/Obstetrics negative OB ROS                           Anesthesia Physical Anesthesia Plan  ASA: III  Anesthesia Plan: General   Post-op Pain Management:    Induction: Intravenous  Airway Management Planned: Oral ETT  Additional Equipment:   Intra-op Plan:   Post-operative Plan: Extubation in OR  Informed Consent: I have reviewed the patients History and Physical, chart, labs and discussed the procedure including the risks, benefits and alternatives for the proposed anesthesia with the patient or authorized representative who has indicated his/her understanding and acceptance.   Dental advisory given  Plan Discussed with: CRNA and Surgeon  Anesthesia Plan Comments:         Anesthesia Quick Evaluation

## 2012-06-28 NOTE — Progress Notes (Signed)
Around 1700, patient complaining of excruciating chest pain. Patient states "I think I have overdone it and gotten up too much." This nurse paged MD and received order for 0.5mg  Dilaudid IV x 1 dose and to repeat in 15 minutes if pain not relieved. I gave one dose and the patient was still in 9/10 pain. I repeated the dose and the patient states that she feels much better and the pain is tolerable now. Angelena Form, RN

## 2012-06-28 NOTE — H&P (Signed)
HPI  30 yof who has undergone a left breast lumpectomy, sentinel node biopsy and radiation therapy for left breast cancer. She has had a lot of issues with "leathery" skin on the left breast and soreness that have been worsening over some time. She has a nl mri done earlier this year due to concerns over her exam. She is due for her mmg in January 2014. Her last mammogram shows a benign skin calcifications as well as some coarse curvilinear calcifications consistent with fat necrosis in her axilla. There are also some benign calcifications in the lumpectomy site. An ultrasound of another area that she had before showed likely fatty necrosis. Her genetic testing previously was negative. I have also done a punch biopsy in the region of the skin changes that showed consistent with radiation changes. She is concerned about the soreness as well as her appearance. She also is very concerned about her cancer risk moving forward. She also states she she has had one episode of non-bloody discharge from her left nipple recently. She reports no changes since our last visit.  She is ready to undergo bilateral total mastectomy.  Past Medical History   Diagnosis  Date   .  Arthritis    .  Asthma    .  Chronic kidney disease    .  Hypertension    .  COPD (chronic obstructive pulmonary disease)    .  GERD (gastroesophageal reflux disease)    .  Breast pain in female  07/03/2011   .  Cancer      kidney and breast   .  Breast CA  07/03/2011     left   .  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  Bladder suspension  2012   .  Bladder surgery  2012     bladder stretched   .  Kidney surgery  2011     right kidney cancer 1/2 kidney removed   .  Breast surgery  2008-2009     left breast lymph nodes   .  Breast surgery  2008-2009     left breast lumpectomy   .  Cholecystectomy  1985   .  Foot surgery      right   .  Abdominal hysterectomy      partial   .   Oophorectomy      left   .  Cesarean section    .  Nasal sinus surgery    .  Bunionectomy     Family History   Problem  Relation  Age of Onset   .  Kidney disease  Father    .  Cancer  Mother       breast    .  Cancer  Paternal Aunt       unaware of 1, and 2nd had breast    Social History  History   Substance Use Topics   .  Smoking status:  Current Every Day Smoker -- 0.3 packs/day   .  Smokeless tobacco:  Never Used   .  Alcohol Use:  No    Allergies   Allergen  Reactions   .  Darvocet (Propoxyphene-Acetaminophen)      Nausea, hives, and itching   .  Doxycycline    .  Morphine And Related    .  Naproxen    .  Nsaids    .  Penicillins      Throat swells, and hives   .  Sulfur      Throat swells and hives   .  Ultracet (Tramadol-Acetaminophen)      Nausea, hives, itching   .  Ultram (Tramadol Hcl)      Nausea, hives, itching    Current Outpatient Prescriptions   Medication  Sig  Dispense  Refill   .  ALPRAZolam (XANAX) 1 MG tablet  Take 1 tablet (1 mg total) by mouth 3 (three) times daily as needed for anxiety.  45 tablet  0   .  AmLODIPine Besylate (NORVASC PO)  Take 20 mg by mouth every morning.     .  IBUPROFEN PO  Take 200 mg by mouth as needed.     .  lansoprazole (PREVACID) 30 MG capsule  Take 20 mg by mouth 2 (two) times daily.     Marland Kitchen  lisinopril (PRINIVIL,ZESTRIL) 10 MG tablet  Take 10 mg by mouth daily.     Marland Kitchen  oxyCODONE (ROXICODONE) 15 MG immediate release tablet  Take 1, IF NEEDED, every 8 hours for pain  45 tablet  0   .  Toremifene Citrate (FARESTON) 60 MG tablet  Take 60 mg by mouth daily.     Marland Kitchen  DISCONTD: ALPRAZolam (XANAX) 1 MG tablet  Take 1 tablet (1 mg total) by mouth 3 (three) times daily as needed for anxiety.  45 tablet  0    Review of Systems  Review of Systems  Pulm: occ wheezing CV: no chest pain  Blood pressure 154/76, pulse 80, temperature 97.6 F (36.4 C), temperature source Temporal, resp. rate 24, height 5\' 4"  (1.626 m), weight 238  lb 6.4 oz (108.138 kg).  Physical Exam  Physical Exam  Vitals reviewed.  Constitutional: She appears well-developed and well-nourished.  Cardiovascular: Normal rate, regular rhythm and normal heart sounds.  Pulmonary/Chest: Breath sounds normal. She has no wheezes. She has no rales. Right breast exhibits no inverted nipple, no mass, no nipple discharge, no skin change and no tenderness. Left breast exhibits skin change (hard leathery skin) and tenderness. Left breast exhibits no inverted nipple and no mass (it is difficult to tell if she has a mass due to her shrunken hard breast and significant skin chagnes).  Lymphadenopathy:  She has no cervical adenopathy.   Assessment   History breast cancer on left s/p lump/sn/xrt  Family history of breast cancer   Plan   I think it is reasonable to do this although she is high risk due to  smoking/radiation therapy. She has significantly reduced smoking and we will do several weeks from now.   I told her she is not candidate for reconstruction at this time but if she stopped smoking completely we could wait and possibly do this with plastic surgery. That would be my preference on the left side but she does not want to do this.  We will plan bilateral total mastectomies. Risks include bleeding, infection, wound disruption requiring dressing changes and possible further procedures, as well as medical risks. I think the left side will be difficult and I expect there will be wound problems. She understands this. We discussed this does not reduce her risk of breast cancer to 0% and she still is at risk even after mastectomy. I think right now it is so difficult to examine the left side and I am concerned about it that it is reasonable to go ahead with this now. I told her this  may not relieve any of her pain issues she has now and may even make them worse.

## 2012-06-29 ENCOUNTER — Telehealth (INDEPENDENT_AMBULATORY_CARE_PROVIDER_SITE_OTHER): Payer: Self-pay | Admitting: General Surgery

## 2012-06-29 ENCOUNTER — Encounter (HOSPITAL_COMMUNITY): Payer: Self-pay | Admitting: General Surgery

## 2012-06-29 LAB — BASIC METABOLIC PANEL
Chloride: 103 mEq/L (ref 96–112)
GFR calc Af Amer: >90 mL/min (ref >90)
Potassium: 4.1 mEq/L (ref 3.5–5.1)

## 2012-06-29 LAB — CBC
HCT: 33.5 % — ABNORMAL LOW (ref 36.0–46.0)
Hemoglobin: 11.7 g/dL — ABNORMAL LOW (ref 12.0–15.0)
RBC: 3.57 MIL/uL — ABNORMAL LOW (ref 3.87–5.11)
WBC: 18.5 10*3/uL — ABNORMAL HIGH (ref 4.0–10.5)

## 2012-06-29 MED ORDER — OXYCODONE HCL 5 MG PO TABS
15.0000 mg | ORAL_TABLET | Freq: Four times a day (QID) | ORAL | Status: DC | PRN
  Administered 2012-06-29 (×2): 15 mg via ORAL
  Filled 2012-06-29: qty 2
  Filled 2012-06-29: qty 3
  Filled 2012-06-29: qty 1

## 2012-06-29 MED ORDER — HYDROMORPHONE HCL PF 1 MG/ML IJ SOLN
1.0000 mg | INTRAMUSCULAR | Status: DC | PRN
  Administered 2012-06-29 – 2012-06-30 (×6): 1 mg via INTRAVENOUS
  Filled 2012-06-29 (×6): qty 1

## 2012-06-29 NOTE — Progress Notes (Signed)
Patient instructed on how to empty JP drains.  Patient stated that she was familiar with drains from previous surgery.  Patient demonstrated how to empty drain, and recharge bulb suction.  Philomena Doheny RN

## 2012-06-29 NOTE — Progress Notes (Signed)
1 Day Post-Op  Subjective: Coughing, significant pain under fair control some preexisting surgery  Objective: Vital signs in last 24 hours: Temp:  [97 F (36.1 C)-98.8 F (37.1 C)] 98.1 F (36.7 C) (12/11 0545) Pulse Rate:  [47-104] 79  (12/11 0545) Resp:  [13-22] 20  (12/11 0545) BP: (120-169)/(53-91) 135/69 mmHg (12/11 0545) SpO2:  [93 %-99 %] 97 % (12/11 0545) Weight:  [246 lb (111.585 kg)] 246 lb (111.585 kg) (12/10 1420) Last BM Date:  (PTA)  Intake/Output from previous day: 12/10 0701 - 12/11 0700 In: 5409 [P.O.:600; I.V.:4809] Out: 1015 [Urine:400; Drains:615] Intake/Output this shift:    General appearance: no distress Resp: diminished breath sounds bibasilar Cardio: regular rate and rhythm Incision/Wound:flaps viable, drains with expected output  Lab Results:   Basename 06/29/12 0400  WBC 18.5*  HGB 11.7*  HCT 33.5*  PLT 221   BMET  Basename 06/29/12 0400  NA 138  K 4.1  CL 103  CO2 27  GLUCOSE 164*  BUN 8  CREATININE 0.65  CALCIUM 9.0   Assessment/Plan: POD 1 bilateral mastectomies 1. Neuro- she has pain issues prior, she will need to stay at least another 24 hours for pain control, will try to transition to oral meds with iv backup today, continue her xanax 2. Pulm toilet 3. Flaps viable, drains with high output but serosang and hct fine 4. Check hct in am 5. Possibly home tomorrow depending on output of drains and pain control    Jezlyn Westerfield 06/29/2012

## 2012-06-29 NOTE — Progress Notes (Signed)
Patient asked/reminded to have family bring in sports bra.  Patient stated that she forgot to ask them, and they were almost here.  Patient was asked if family could purchase one and bring it.  Patient stated that family lives from paycheck to paycheck.  Patient ambulated independently in the halls three times.  Patient still complaining of severe pain.  Binder was adjusted, which improved pain.  Philomena Doheny RN

## 2012-06-29 NOTE — Progress Notes (Signed)
Patient seen by other nurse walking in visitor parking lot.  Patient was suspected of leaving earlier and was informed that she wasn't allowed to leave floor.  Patient told again that this was not allowed.  Philomena Doheny RN

## 2012-06-29 NOTE — Telephone Encounter (Signed)
LMOM letting pt know that her first PO appt will be on 12/17 at 4:30

## 2012-06-30 ENCOUNTER — Observation Stay (HOSPITAL_COMMUNITY): Payer: Self-pay

## 2012-06-30 LAB — CBC
HCT: 32 % — ABNORMAL LOW (ref 36.0–46.0)
Hemoglobin: 10.7 g/dL — ABNORMAL LOW (ref 12.0–15.0)
MCH: 31.8 pg (ref 26.0–34.0)
MCV: 95 fL (ref 78.0–100.0)
RBC: 3.37 MIL/uL — ABNORMAL LOW (ref 3.87–5.11)

## 2012-06-30 LAB — CK TOTAL AND CKMB (NOT AT ARMC)
CK, MB: 2.2 ng/mL (ref 0.3–4.0)
Relative Index: INVALID (ref 0.0–2.5)
Relative Index: INVALID (ref 0.0–2.5)

## 2012-06-30 LAB — TROPONIN I: Troponin I: <0.3 ng/mL (ref <0.30)

## 2012-06-30 MED ORDER — HYDROMORPHONE HCL PF 1 MG/ML IJ SOLN
1.0000 mg | INTRAMUSCULAR | Status: DC | PRN
  Administered 2012-06-30 – 2012-07-01 (×7): 1 mg via INTRAVENOUS
  Filled 2012-06-30 (×7): qty 1

## 2012-06-30 MED ORDER — GI COCKTAIL ~~LOC~~
30.0000 mL | Freq: Once | ORAL | Status: AC
  Administered 2012-06-30: 30 mL via ORAL
  Filled 2012-06-30: qty 30

## 2012-06-30 MED ORDER — OXYCODONE HCL 5 MG PO TABS
15.0000 mg | ORAL_TABLET | ORAL | Status: DC | PRN
  Administered 2012-06-30 – 2012-07-01 (×7): 15 mg via ORAL
  Filled 2012-06-30 (×7): qty 3

## 2012-06-30 NOTE — Progress Notes (Signed)
2 Days Post-Op  Subjective: Complains of pain at surgical sites and right arm, coughing productive of some white/green sputum  Objective: Vital signs in last 24 hours: Temp:  [97.4 F (36.3 C)-98.2 F (36.8 C)] 97.4 F (36.3 C) (12/12 0500) Pulse Rate:  [53-72] 53  (12/12 0500) Resp:  [18-20] 20  (12/12 0500) BP: (113-137)/(54-77) 114/62 mmHg (12/12 0500) SpO2:  [93 %-100 %] 93 % (12/12 0500) Last BM Date:  (PTA)  Intake/Output from previous day: 12/11 0701 - 12/12 0700 In: 1320 [P.O.:1320] Out: 215 [Drains:215] Intake/Output this shift:    General appearance: no distress Resp: bilateral occ exp wheezes, decreased breath sounds bibasilar Cardio: regular rate and rhythm GI: soft Flaps viable, drains with expected serous output  Lab Results:   Basename 06/30/12 0358 06/29/12 0400  WBC 10.7* 18.5*  HGB 10.7* 11.7*  HCT 32.0* 33.5*  PLT 189 221    Assessment/Plan: POD 2 bilateral mastectomy 1. Will try and keep at oral pain meds today for hopeful discharge tomorrow 2. Aggressive pulm toilet, will add some albuterol today, check cxr given symptoms although I don't think she has a pna 3. scds  Vester Balthazor 06/30/2012

## 2012-06-30 NOTE — Progress Notes (Signed)
I saw [name] as a social visit only. I want to thank Dr. Dwain Sarna for his wonderful care and his surgical expertise. She looks really good. She has the expected postoperative discomfort. She has the drainage tubes in.  I did see the path report. She had some ductal hyperplasia. This could of been an issue down the road if her breast was still intact.  Hopefully she'll be going home tomorrow. She's out of bed. She's been okay.  Again, Dr. Dwain Sarna did a great job with her. I hope with her mastectomies, pain issues will be cut back significantly.  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 This will be dealt with at [facility] in a few months.  I am glad that things go well for her during this brief hospital stay.  Pete E.

## 2012-06-30 NOTE — Progress Notes (Signed)
Patient complaining of pressure on chest.  Describes pain as crushing and like a 1000 pounds on her chest.  EKG normal.  Dr. Derrell Lolling notified, with order received.  Philomena Doheny RN

## 2012-07-01 MED ORDER — ONDANSETRON HCL 4 MG PO TABS: 4.0000 mg | ORAL_TABLET | Freq: Four times a day (QID) | ORAL | Status: DC | PRN

## 2012-07-01 MED ORDER — ALPRAZOLAM 1 MG PO TABS: 1.0000 mg | ORAL_TABLET | Freq: Three times a day (TID) | ORAL | Status: DC | PRN

## 2012-07-01 MED ORDER — OXYCODONE HCL 15 MG PO TABS: 15.0000 mg | ORAL_TABLET | ORAL | Status: DC | PRN

## 2012-07-01 MED ORDER — ONDANSETRON HCL 4 MG/2ML IJ SOLN
INTRAMUSCULAR | Status: AC
  Filled 2012-07-01: qty 2

## 2012-07-01 MED ORDER — PROMETHAZINE HCL 25 MG/ML IJ SOLN: 12.5000 mg | Freq: Four times a day (QID) | INTRAMUSCULAR | Status: DC | PRN

## 2012-07-01 MED ORDER — ONDANSETRON HCL 4 MG/2ML IJ SOLN
4.0000 mg | Freq: Four times a day (QID) | INTRAMUSCULAR | Status: DC | PRN
  Administered 2012-07-01: 4 mg via INTRAVENOUS

## 2012-07-01 NOTE — Progress Notes (Signed)
3 Days Post-Op  Subjective: Episode pain yesterday with negative ekg and enzymes, cxr fine also, I think this was mostly surgically related. This is better today.  She is up and around.  Pain under decent control.   Objective: Vital signs in last 24 hours: Temp:  [98 F (36.7 C)-99.5 F (37.5 C)] 98 F (36.7 C) (12/13 0738) Pulse Rate:  [63-85] 72  (12/13 0738) Resp:  [18-20] 20  (12/13 0738) BP: (107-156)/(44-87) 156/87 mmHg (12/13 0738) SpO2:  [94 %-100 %] 100 % (12/13 0738) Last BM Date:  (PTA)  Intake/Output from previous day: 12/12 0701 - 12/13 0700 In: 840 [P.O.:840] Out: 245 [Drains:245] Intake/Output this shift:    General appearance: no distress Incision/Wound:flaps viable, drains with serous output, some mild erythema on right sided flaps laterally but I don't think she has infection  Lab Results:   Basename 06/30/12 0358 06/29/12 0400  WBC 10.7* 18.5*  HGB 10.7* 11.7*  HCT 32.0* 33.5*  PLT 189 221   BMET  Basename 06/29/12 0400  NA 138  K 4.1  CL 103  CO2 27  GLUCOSE 164*  BUN 8  CREATININE 0.65  CALCIUM 9.0     Assessment/Plan: POD 3 bilateral mastectomies 1. Will dc home today on oral pain meds, drain instructions 2. I will send home on abx while drains in place given radiation/smoking/habitus 3. Will call and setup appt for next tuesday  [facility] 07/01/2012

## 2012-07-05 ENCOUNTER — Ambulatory Visit (INDEPENDENT_AMBULATORY_CARE_PROVIDER_SITE_OTHER): Payer: Self-pay | Admitting: General Surgery

## 2012-07-05 ENCOUNTER — Encounter (INDEPENDENT_AMBULATORY_CARE_PROVIDER_SITE_OTHER): Payer: Self-pay | Admitting: General Surgery

## 2012-07-05 VITALS — BP 134/84 | HR 76 | Temp 99.0°F | Resp 24

## 2012-07-05 DIAGNOSIS — Z09 Encounter for follow-up examination after completed treatment for conditions other than malignant neoplasm: Secondary | ICD-10-CM

## 2012-07-05 DIAGNOSIS — N644 Mastodynia: Secondary | ICD-10-CM

## 2012-07-05 MED ORDER — PROMETHAZINE HCL 12.5 MG PO TABS: 12.5000 mg | ORAL_TABLET | Freq: Four times a day (QID) | ORAL | Status: DC | PRN

## 2012-07-05 MED ORDER — OXYCODONE HCL 15 MG PO TABS: 15.0000 mg | ORAL_TABLET | ORAL | Status: DC | PRN

## 2012-07-05 NOTE — Addendum Note (Signed)
Addended byEmelia Loron on: 07/05/2012 09:19 AM   Modules accepted: Orders

## 2012-07-05 NOTE — Discharge Summary (Signed)
Physician Discharge Summary  Patient ID: [name] MRN: [id number] DOB/AGE: Jan 02, 1966 46 y.o.  Admit date: 06/28/2012 Discharge date: 07/05/2012  Admission Diagnoses: Smoking History of left breast cancer treated with lumpectomy, snbx, xrt  Discharge Diagnoses:  Same as above, Chronic pain  Discharged Condition: fair  Hospital Course: 89 yof who I know from prior treatment for breast cancer As listed above. Her left breast has become very hard to follow due to significant radiation changes. She is also very concerned about the development of a new breast cancer. We discussed multiple options. In the hand she very much wants to proceed with a bilateral total mastectomy. We discussed possible reconstruction but she's not really a candidate for that right now due for smoking and she did not want to pursue this at this time either. She was admitted and underwent bilateral total mastectomy without difficulty. She recovered on the floor for 2 days postoperatively. She was eating and tolerating her diet just fine. The main issue she remained was for pain control. There was a lot of difficulty in managing her pain. By postoperative day 2 she was released up moving around and her pain was under control with oral medications. Her flaps were all viable and drains were putting out the expected output.  Consults: None  Significant Diagnostic Studies: none  Treatments: surgery: bilateral total mastectomies  Disposition: 01-Home or Self Care  Discharge Orders    Future Appointments: Provider: Department: Dept Phone: Center:   07/05/2012 8:45 AM Emelia Loron, MD [facility] [phone] None   08/01/2012 10:00 AM Rachael Fee [facility] [phone] None   08/01/2012 10:30 AM Josph Macho, MD [facility] [phone] None       Medication List     As of 07/05/2012  8:44 AM    TAKE these medications         ALPRAZolam 1 MG tablet   Commonly known as: XANAX   Take 1 tablet (1 mg total) by mouth 3 (three) times daily as needed for anxiety.      ALPRAZolam 1 MG tablet   Commonly known as: XANAX   Take 1 tablet (1 mg total) by mouth 3 (three) times daily as needed for anxiety.      amLODipine 10 MG tablet   Commonly known as: NORVASC   Take 10 mg by mouth daily before breakfast.      FARESTON 60 MG tablet   Generic drug: Toremifene Citrate   Take 60 mg by mouth daily.      ibuprofen 200 MG tablet   Commonly known as: ADVIL,MOTRIN   Take 600 mg by mouth every 6 (six) hours as needed. Pain      lisinopril 10 MG tablet   Commonly known as: PRINIVIL,ZESTRIL   Take 10 mg by mouth daily before breakfast.      oxyCODONE 15 MG immediate release tablet   Commonly known as: ROXICODONE   Take 1, IF NEEDED, every 8 hours for pain      oxyCODONE 15 MG immediate release tablet   Commonly known as: ROXICODONE   Take 1 tablet (15 mg total) by mouth every 4 (four) hours as needed.      PREVACID 30 MG capsule   Generic drug: lansoprazole   Take 30 mg by mouth 2 (two) times daily.           Follow-up Information    Follow up with  Emelia Loron, MD. On 07/05/2012.   Contact information:   [address] [place] [address] [phone]          Signed: Emelia Loron 07/05/2012, 8:44 AM

## 2012-07-05 NOTE — Progress Notes (Signed)
Subjective:     Patient ID: Howard Pouch, female   DOB: [birth year], 46 y.o.   MRN: [id number]  HPI This is a 46 year old female who I recently did bilateral total mastectomies. She was in the hospital for 2 days postoperatively for pain control. She returns today with some significant complaints of pain. She's been doing a little bit too much around her house I think also. She does not really have her drain outputs. She has had some drainage around her tubes at this point. She has some nausea. She is otherwise doing okay and taking care of her drains well.  Review of Systems     Objective:   Physical Exam Incisions without infection, flaps viable, drains with expected serosang output    Assessment:     S/p bilateral total mastectomies    Plan:     I think she needs to decrease some of her activity at this point. Her wounds look well today. She is going to begin using some Motrin for the next several days in addition to her other medications. I asked her to decrease her Xanax to one half tablet at night. I'm going to add some Phenergan for some nausea that she is having. I will plan on seeing her back next Monday or sooner if needed.

## 2012-07-05 NOTE — Patient Instructions (Addendum)
May leave binder off but I would like you to wear a sports bra. Take 200 mg ibuprofen daily for next three days and then as needed Use xanax once daily if needed at night and may cut in half. Use phenergan for nausea but I do not want you taking phenergan, xanax and oxycodone together. Bring drain outputs to next visit.

## 2012-07-11 ENCOUNTER — Encounter (INDEPENDENT_AMBULATORY_CARE_PROVIDER_SITE_OTHER): Payer: Self-pay | Admitting: General Surgery

## 2012-07-11 ENCOUNTER — Ambulatory Visit (INDEPENDENT_AMBULATORY_CARE_PROVIDER_SITE_OTHER): Payer: Self-pay | Admitting: General Surgery

## 2012-07-11 VITALS — BP 138/86 | HR 79 | Temp 98.2°F | Resp 18 | Ht 64.0 in | Wt 235.0 lb

## 2012-07-11 DIAGNOSIS — Z09 Encounter for follow-up examination after completed treatment for conditions other than malignant neoplasm: Secondary | ICD-10-CM

## 2012-07-11 DIAGNOSIS — N644 Mastodynia: Secondary | ICD-10-CM

## 2012-07-11 MED ORDER — OXYCODONE HCL 15 MG PO TABS: 15.0000 mg | ORAL_TABLET | ORAL | Status: DC | PRN

## 2012-07-11 NOTE — Progress Notes (Signed)
Subjective:     Patient ID: Howard Pouch, female   DOB: August 20, 1965, 46 y.o.   MRN: [id number]  HPI 43 yof who underwent recent bilateral total mastectomies.  Both of her drains remain in place. She has had a lot of pain issues postoperatively.  Since our last visit she has had some drainage from medial aspect of her left sided incision associated with foul smelling drainage.  The drain had not been holding suction but is now and has cloudy fluid that is purulent present.  She denies any fevers.  She was actually seen in er last night and given antibiotics.  The right side has had some pain but no real other complaints.    Review of Systems     Objective:   Physical Exam Left sided incision with no drainage from incision today, drain is functional with some purulent fluid there is no associated cellulitis or any fluid behind the flaps right now Right side with some edema and murky fluid with clot in drain but no cellulitis    Assessment:     SSI left breast     Plan:     She is not systemically ill right now and I think she has localized infection at left mastectomy site that I think is completely drained right now. She has a lot of pain issues as well.  This was present preop also.  I offered to admit her and place on iv abx with option of opening the incision on left particularly if does not improve.  She would like to try and manage at home for now.  We discussed filling the rx the er gave her and starting these abx.  I told her if any worse symptoms or fever she needs to come in.  I asked her to call me on Wednesday am on call to let me know how things are going.  If she continues to worsen she will need admission and possibly surgery.

## 2012-07-13 ENCOUNTER — Encounter (HOSPITAL_COMMUNITY): Payer: Self-pay

## 2012-07-13 ENCOUNTER — Inpatient Hospital Stay (HOSPITAL_COMMUNITY)
Admission: EM | Admit: 2012-07-13 | Discharge: 2012-07-20 | DRG: 863 | Disposition: A | Discharge status: Home Health | Payer: MEDICAID | Attending: Surgery | Admitting: Surgery

## 2012-07-13 DIAGNOSIS — K219 Gastro-esophageal reflux disease without esophagitis: Secondary | ICD-10-CM | POA: Diagnosis present

## 2012-07-13 DIAGNOSIS — N189 Chronic kidney disease, unspecified: Secondary | ICD-10-CM | POA: Diagnosis present

## 2012-07-13 DIAGNOSIS — F17200 Nicotine dependence, unspecified, uncomplicated: Secondary | ICD-10-CM | POA: Diagnosis present

## 2012-07-13 DIAGNOSIS — T8140XA Infection following a procedure, unspecified, initial encounter: Principal | ICD-10-CM | POA: Diagnosis present

## 2012-07-13 DIAGNOSIS — N644 Mastodynia: Secondary | ICD-10-CM

## 2012-07-13 DIAGNOSIS — Y836 Removal of other organ (partial) (total) as the cause of abnormal reaction of the patient, or of later complication, without mention of misadventure at the time of the procedure: Secondary | ICD-10-CM | POA: Diagnosis present

## 2012-07-13 DIAGNOSIS — Z85528 Personal history of other malignant neoplasm of kidney: Secondary | ICD-10-CM

## 2012-07-13 DIAGNOSIS — F329 Major depressive disorder, single episode, unspecified: Secondary | ICD-10-CM

## 2012-07-13 DIAGNOSIS — Z9010 Acquired absence of unspecified breast and nipple: Secondary | ICD-10-CM

## 2012-07-13 DIAGNOSIS — J449 Chronic obstructive pulmonary disease, unspecified: Secondary | ICD-10-CM | POA: Diagnosis present

## 2012-07-13 DIAGNOSIS — G8918 Other acute postprocedural pain: Secondary | ICD-10-CM | POA: Diagnosis present

## 2012-07-13 DIAGNOSIS — M129 Arthropathy, unspecified: Secondary | ICD-10-CM | POA: Diagnosis present

## 2012-07-13 DIAGNOSIS — Z23 Encounter for immunization: Secondary | ICD-10-CM

## 2012-07-13 DIAGNOSIS — F411 Generalized anxiety disorder: Secondary | ICD-10-CM | POA: Diagnosis present

## 2012-07-13 DIAGNOSIS — I82A21 Chronic embolism and thrombosis of right axillary vein: Secondary | ICD-10-CM

## 2012-07-13 DIAGNOSIS — Y838 Other surgical procedures as the cause of abnormal reaction of the patient, or of later complication, without mention of misadventure at the time of the procedure: Secondary | ICD-10-CM | POA: Diagnosis present

## 2012-07-13 DIAGNOSIS — R071 Chest pain on breathing: Secondary | ICD-10-CM | POA: Diagnosis present

## 2012-07-13 DIAGNOSIS — I129 Hypertensive chronic kidney disease with stage 1 through stage 4 chronic kidney disease, or unspecified chronic kidney disease: Secondary | ICD-10-CM | POA: Diagnosis present

## 2012-07-13 DIAGNOSIS — C50919 Malignant neoplasm of unspecified site of unspecified female breast: Secondary | ICD-10-CM | POA: Diagnosis present

## 2012-07-13 DIAGNOSIS — G43909 Migraine, unspecified, not intractable, without status migrainosus: Secondary | ICD-10-CM | POA: Diagnosis present

## 2012-07-13 DIAGNOSIS — J4489 Other specified chronic obstructive pulmonary disease: Secondary | ICD-10-CM | POA: Diagnosis present

## 2012-07-13 LAB — BASIC METABOLIC PANEL
BUN: 8 mg/dL (ref 6–23)
BUN: 8 mg/dL (ref 6–23)
Calcium: 8.9 mg/dL (ref 8.4–10.5)
Creatinine, Ser: 0.72 mg/dL (ref 0.50–1.10)
Creatinine, Ser: 0.69 mg/dL (ref 0.50–1.10)
GFR calc Af Amer: >90 mL/min (ref >90)
GFR calc Af Amer: >90 mL/min (ref >90)
GFR calc non Af Amer: >90 mL/min (ref >90)
Glucose, Bld: 132 mg/dL — ABNORMAL HIGH (ref 70–99)
Potassium: 3.5 mEq/L (ref 3.5–5.1)

## 2012-07-13 LAB — CBC WITH DIFFERENTIAL/PLATELET
Basophils Relative: 0 % (ref 0–1)
Eosinophils Absolute: 0.6 10*3/uL (ref 0.0–0.7)
Eosinophils Relative: 6 % — ABNORMAL HIGH (ref 0–5)
HCT: 38.8 % (ref 36.0–46.0)
Hemoglobin: 13.8 g/dL (ref 12.0–15.0)
Lymphs Abs: 2.2 10*3/uL (ref 0.7–4.0)
MCH: 32.4 pg (ref 26.0–34.0)
MCHC: 35.6 g/dL (ref 30.0–36.0)
MCV: 91.1 fL (ref 78.0–100.0)
Monocytes Absolute: 0.7 10*3/uL (ref 0.1–1.0)
Monocytes Relative: 7 % (ref 3–12)
RBC: 4.26 MIL/uL (ref 3.87–5.11)

## 2012-07-13 LAB — CBC
HCT: 37 % (ref 36.0–46.0)
MCH: 30.6 pg (ref 26.0–34.0)
MCHC: 33.2 g/dL (ref 30.0–36.0)
MCV: 92 fL (ref 78.0–100.0)
Platelets: 389 10*3/uL (ref 150–400)
RDW: 12.9 % (ref 11.5–15.5)

## 2012-07-13 MED ORDER — ACETAMINOPHEN 650 MG RE SUPP: 650.0000 mg | Freq: Four times a day (QID) | RECTAL | Status: DC | PRN

## 2012-07-13 MED ORDER — HYDROMORPHONE HCL PF 1 MG/ML IJ SOLN
1.0000 mg | INTRAMUSCULAR | Status: DC | PRN
  Administered 2012-07-13 – 2012-07-15 (×12): 1 mg via INTRAVENOUS
  Filled 2012-07-13 (×12): qty 1

## 2012-07-13 MED ORDER — ONDANSETRON HCL 4 MG/2ML IJ SOLN
4.0000 mg | Freq: Four times a day (QID) | INTRAMUSCULAR | Status: DC | PRN
  Administered 2012-07-14 – 2012-07-20 (×10): 4 mg via INTRAVENOUS
  Filled 2012-07-13 (×12): qty 2

## 2012-07-13 MED ORDER — VANCOMYCIN HCL IN DEXTROSE 1-5 GM/200ML-% IV SOLN
1000.0000 mg | Freq: Two times a day (BID) | INTRAVENOUS | Status: DC
  Administered 2012-07-14 – 2012-07-15 (×3): 1000 mg via INTRAVENOUS
  Filled 2012-07-13 (×5): qty 200

## 2012-07-13 MED ORDER — LISINOPRIL 10 MG PO TABS
10.0000 mg | ORAL_TABLET | Freq: Every day | ORAL | Status: DC
  Administered 2012-07-14 – 2012-07-20 (×5): 10 mg via ORAL
  Filled 2012-07-13 (×8): qty 1

## 2012-07-13 MED ORDER — TOREMIFENE CITRATE 60 MG PO TABS: 60.0000 mg | ORAL_TABLET | Freq: Every day | ORAL | Status: DC

## 2012-07-13 MED ORDER — OXYCODONE HCL 5 MG PO TABS
15.0000 mg | ORAL_TABLET | ORAL | Status: DC | PRN
  Administered 2012-07-13 – 2012-07-15 (×8): 15 mg via ORAL
  Filled 2012-07-13 (×8): qty 3

## 2012-07-13 MED ORDER — POTASSIUM CHLORIDE 20 MEQ/15ML (10%) PO LIQD
40.0000 meq | Freq: Once | ORAL | Status: DC
  Filled 2012-07-13: qty 30

## 2012-07-13 MED ORDER — PANTOPRAZOLE SODIUM 40 MG IV SOLR
40.0000 mg | Freq: Every day | INTRAVENOUS | Status: DC
  Administered 2012-07-13: 40 mg via INTRAVENOUS
  Filled 2012-07-13 (×2): qty 40

## 2012-07-13 MED ORDER — ACETAMINOPHEN 325 MG PO TABS
650.0000 mg | ORAL_TABLET | Freq: Four times a day (QID) | ORAL | Status: DC | PRN
  Administered 2012-07-16 – 2012-07-18 (×3): 650 mg via ORAL
  Filled 2012-07-13 (×3): qty 2

## 2012-07-13 MED ORDER — VANCOMYCIN HCL 10 G IV SOLR
2000.0000 mg | Freq: Once | INTRAVENOUS | Status: AC
  Administered 2012-07-13: 2000 mg via INTRAVENOUS
  Filled 2012-07-13: qty 2000

## 2012-07-13 MED ORDER — HEPARIN SODIUM (PORCINE) 5000 UNIT/ML IJ SOLN
5000.0000 [IU] | Freq: Three times a day (TID) | INTRAMUSCULAR | Status: DC
  Administered 2012-07-13 – 2012-07-20 (×19): 5000 [IU] via SUBCUTANEOUS
  Filled 2012-07-13 (×23): qty 1

## 2012-07-13 MED ORDER — SODIUM CHLORIDE 0.9 % IV SOLN
INTRAVENOUS | Status: DC
  Administered 2012-07-13 – 2012-07-14 (×2): via INTRAVENOUS

## 2012-07-13 MED ORDER — AMLODIPINE BESYLATE 10 MG PO TABS
10.0000 mg | ORAL_TABLET | Freq: Every day | ORAL | Status: DC
  Administered 2012-07-14 – 2012-07-20 (×5): 10 mg via ORAL
  Filled 2012-07-13 (×8): qty 1

## 2012-07-13 MED ORDER — OXYCODONE HCL 5 MG PO TABS: 15.0000 mg | ORAL_TABLET | ORAL | Status: DC | PRN

## 2012-07-13 MED ORDER — ALPRAZOLAM 0.5 MG PO TABS
1.0000 mg | ORAL_TABLET | Freq: Three times a day (TID) | ORAL | Status: DC | PRN
  Administered 2012-07-13 – 2012-07-20 (×18): 1 mg via ORAL
  Filled 2012-07-13 (×2): qty 2
  Filled 2012-07-13 (×2): qty 1
  Filled 2012-07-13 (×3): qty 2
  Filled 2012-07-13: qty 1
  Filled 2012-07-13: qty 4
  Filled 2012-07-13: qty 1
  Filled 2012-07-13 (×4): qty 2
  Filled 2012-07-13: qty 1
  Filled 2012-07-13 (×2): qty 2
  Filled 2012-07-13: qty 1
  Filled 2012-07-13 (×4): qty 2

## 2012-07-13 NOTE — Progress Notes (Signed)
ANTIBIOTIC CONSULT NOTE - INITIAL  Pharmacy Consult for Vancomycin Indication: Drainage from JP tubes s/p bilateral mastectomy  Allergies  Allergen Reactions  . Penicillins Anaphylaxis and Hives  . Sulfur Anaphylaxis and Hives  . Darvocet (Propoxyphene-Acetaminophen)     Nausea, hives, and itching    . Doxycycline Nausea And Vomiting  . Morphine And Related Nausea Only  . Naproxen Nausea And Vomiting  . Nsaids Nausea And Vomiting  . Tape Other (See Comments)    Rips skin  . Toradol (Ketorolac Tromethamine) Nausea And Vomiting  . Ultracet (Tramadol-Acetaminophen) Hives, Itching and Nausea And Vomiting  . Ultram (Tramadol Hcl)     Nausea, hives, itching     Patient Measurements: Height: 5\' 4"  (162.6 cm) Weight: 232 lb (105.235 kg) IBW/kg (Calculated) : 54.7   Vital Signs: Temp: 98.5 F (36.9 C) (12/25 1507) Temp src: Oral (12/25 1507) BP: 105/85 mmHg (12/25 1507) Pulse Rate: 62  (12/25 1507) Intake/Output from previous day:   Intake/Output from this shift:   Labs:  [facility] 07/13/12 1234 07/13/12 1119  WBC 9.1 10.9*  HGB 12.3 13.8  PLT 389 391  LABCREA -- --  CREATININE 0.69 0.72   Estimated Creatinine Clearance: 102.8 ml/min (by C-G formula based on Cr of 0.69). No results found for this basename: VANCOTROUGH:2,VANCOPEAK:2,VANCORANDOM:2,GENTTROUGH:2,GENTPEAK:2,GENTRANDOM:2,TOBRATROUGH:2,TOBRAPEAK:2,TOBRARND:2,AMIKACINPEAK:2,AMIKACINTROU:2,AMIKACIN:2, in the last 72 hours   Microbiology: Recent Results (from the past 720 hour(s))  SURGICAL PCR SCREEN     Status: Abnormal   Collection Time   06/23/12  9:00 AM      Component Value Range Status Comment   MRSA, PCR NEGATIVE  NEGATIVE Final    Staphylococcus aureus POSITIVE (*) NEGATIVE Final     Medical History: Past Medical History  Diagnosis Date  . Arthritis   . Chronic kidney disease   . Hypertension   . COPD (chronic obstructive pulmonary disease)   . GERD (gastroesophageal reflux disease)   . Breast  pain in female 07/03/2011  . Asthma     mild  . Anxiety   . Seizures 20 years ago    x1  . Cancer     kidney and breast  . Breast CA 07/03/2011    left  . 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 PONV (postoperative nausea and vomiting)     hard to wake up  . Tendinitis     both hands to elbows    Medications:  Scheduled:    . amLODipine  10 mg Oral QAC breakfast  . heparin  5,000 Units Subcutaneous Q8H  . lisinopril  10 mg Oral QAC breakfast  . pantoprazole (PROTONIX) IV  40 mg Intravenous QHS  . Toremifene Citrate  60 mg Oral Daily   Assessment: Pt is a 46 y/o F s/p bilateral mastectomy recently discharged on 07/05/12. Pt now presents to the ED with yellow drainage from L JP drain, and some drainage for the R JP drain. WBC 9.1, SCr 0.69, CrCl ~ 13mL/min, no current cx. Per patient, has not taken any anti-biotics as an outpatient since discharge. Pharmacy is now consulted to dose vancomycin. Noted patient with multiple drug allergies including PCN and Sulfur anaphylaxis.   Goal of Therapy:  Vancomycin trough level 15-20 mcg/ml  Plan:  - Vancomycin 2000mg  IV x 1  - Vancomycin 1000mg  q12h - Steady state vancomycin levels - f/u MD plan - f/u renal function, cultures, WBC, temp  Abran Duke, PharmD Clinical Pharmacist Phone: [phone] Pager: [phone] 07/13/2012 3:42 PM

## 2012-07-13 NOTE — ED Notes (Signed)
Upon arrival to ED the pts bilateral JP tubes are draining, the L JP has large amt of  thick & yellow drainage, the R JP has large amt of thin serosangenous drainage

## 2012-07-13 NOTE — H&P (Signed)
[name] is an 46 y.o. female.   Chief Complaint: infection s/p mastectomy HPI: 43 yof who underwent recent bilateral total mastectomies. Both of her drains remain in place. She has had a lot of pain issues postoperatively. I saw her 2 days ago when she has had some drainage from medial aspect of her left sided incision associated with foul smelling drainage. The drain had not been holding suction but is now and has cloudy fluid that is purulent present. She denies any fevers. She was actually seen in er two days ago and given antibiotics. The right side has had some pain but no real other complaints.  She wanted to try and manage as outpatient but I talked to her this am and she still had pain with foul smelling drainage.  She comes in today with pain and purulent drainage from the left side.  The right side is fine now.  Past Medical History  Diagnosis Date  . Arthritis   . Chronic kidney disease   . Hypertension   . COPD (chronic obstructive pulmonary disease)   . GERD (gastroesophageal reflux disease)   . Breast pain in female 07/03/2011  . Asthma     mild  . Anxiety   . Seizures 20 years ago    x1  . Cancer     kidney and breast  . Breast CA 07/03/2011    left  . 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 PONV (postoperative nausea and vomiting)     hard to wake up  . Tendinitis     both hands to elbows    Past Surgical History  Procedure Date  . Bladder suspension 2012  . Bladder surgery 2012    bladder stretched  . Kidney surgery 2011    right kidney cancer 1/2 kidney removed   . Cholecystectomy 1985  . Foot surgery     right  . Oophorectomy     left  . Cesarean section   . Nasal sinus surgery   . Bunionectomy   . Breast surgery 2008-2009    left breast 3  lymph nodes  . Breast surgery 2008-2009    left breast lumpectomy   . Abdominal hysterectomy at 46 years old    partial  . Cardiac catheterization 2012  . Total mastectomy 06/28/2012    Procedure:  TOTAL MASTECTOMY;  Surgeon: Carolyn Loron, MD;  Location: [facility];  Service: General;  Laterality: Bilateral;    Family History  Problem Relation Age of Onset  . Kidney disease Father   . Cancer Mother     breast  . Cancer Paternal Aunt     unaware of 1, and 2nd had breast   Social History:  reports that she has been smoking Cigarettes.  She has a 10 pack-year smoking history. She has never used smokeless tobacco. She reports that she does not drink alcohol or use illicit drugs.  Allergies:  Allergies  Allergen Reactions  . Penicillins Anaphylaxis and Hives  . Sulfur Anaphylaxis and Hives  . Darvocet (Propoxyphene-Acetaminophen)     Nausea, hives, and itching    . Doxycycline Nausea And Vomiting  . Morphine And Related Nausea Only  . Naproxen Nausea And Vomiting  . Nsaids Nausea And Vomiting  . Tape Other (See Comments)    Rips skin  . Toradol (Ketorolac Tromethamine) Nausea And Vomiting  . Ultracet (Tramadol-Acetaminophen) Hives, Itching and Nausea And Vomiting  . Ultram (Tramadol Hcl)     Nausea, hives, itching  Meds reviewed  Results for orders placed during the hospital encounter of 07/13/12 (from the past 48 hour(s))  CBC WITH DIFFERENTIAL     Status: Abnormal   Collection Time   07/13/12 11:19 AM      Component Value Range Comment   WBC 10.9 (*) 4.0 - 10.5 K/uL    RBC 4.26  3.87 - 5.11 MIL/uL    Hemoglobin 13.8  12.0 - 15.0 g/dL    HCT 78.2  95.6 - 21.3 %    MCV 91.1  78.0 - 100.0 fL    MCH 32.4  26.0 - 34.0 pg    MCHC 35.6  30.0 - 36.0 g/dL    RDW 08.6  57.8 - 46.9 %    Platelets 391  150 - 400 K/uL    Neutrophils Relative 68  43 - 77 %    Neutro Abs 7.4  1.7 - 7.7 K/uL    Lymphocytes Relative 20  12 - 46 %    Lymphs Abs 2.2  0.7 - 4.0 K/uL    Monocytes Relative 7  3 - 12 %    Monocytes Absolute 0.7  0.1 - 1.0 K/uL    Eosinophils Relative 6 (*) 0 - 5 %    Eosinophils Absolute 0.6  0.0 - 0.7 K/uL    Basophils Relative 0  0 - 1 %    Basophils  Absolute 0.0  0.0 - 0.1 K/uL   BASIC METABOLIC PANEL     Status: Abnormal   Collection Time   07/13/12 11:19 AM      Component Value Range Comment   Sodium 136  135 - 145 mEq/L    Potassium 3.5  3.5 - 5.1 mEq/L    Chloride 98  96 - 112 mEq/L    CO2 24  19 - 32 mEq/L    Glucose, Bld 132 (*) 70 - 99 mg/dL    BUN 8  6 - 23 mg/dL    Creatinine, Ser 6.29  0.50 - 1.10 mg/dL    Calcium 9.0  8.4 - 52.8 mg/dL    GFR calc non Af Amer >90  >90 mL/min    GFR calc Af Amer >90  >90 mL/min    No results found.  Review of Systems  Constitutional: Negative for fever and chills.  Respiratory: Positive for cough.   Gastrointestinal: Positive for nausea. Negative for vomiting and abdominal pain.    Blood pressure 123/85, pulse 82, temperature 98.2 F (36.8 C), temperature source Oral, resp. rate 20, SpO2 99.00%. Physical Exam  Vitals reviewed. Constitutional: She appears well-developed and well-nourished.  Cardiovascular: Normal rate, regular rhythm and normal heart sounds.   Respiratory: Effort normal. She has wheezes (end exp).    GI: Soft.     Assessment/Plan Abscess s/p mastectomy I drained this and I think should be enough combined with abx.  Admit for dressing changes iv abx, hopeful discharge soon on home wound care and po abx.  We discussed this may need to be opened further.  Pain control issues as well.  [name] 07/13/2012, 12:40 PM

## 2012-07-13 NOTE — ED Notes (Signed)
Patient unable to stand during orthostatic vital signs due to bleeding

## 2012-07-13 NOTE — ED Notes (Signed)
Pt presents with infection to L drainage tube from double mastectomy on 12/10.  Pt reports drainage from insertion site, denies any fever.  Pt seen at surgeon's office on Thursday, has been on abx.

## 2012-07-14 LAB — BASIC METABOLIC PANEL
Calcium: 8.4 mg/dL (ref 8.4–10.5)
Creatinine, Ser: 0.73 mg/dL (ref 0.50–1.10)
GFR calc non Af Amer: >90 mL/min (ref >90)
Sodium: 136 mEq/L (ref 135–145)

## 2012-07-14 LAB — CBC
MCH: 31.2 pg (ref 26.0–34.0)
MCV: 92.3 fL (ref 78.0–100.0)
Platelets: 354 10*3/uL (ref 150–400)
RBC: 3.75 MIL/uL — ABNORMAL LOW (ref 3.87–5.11)
RDW: 13.3 % (ref 11.5–15.5)
WBC: 8.3 10*3/uL (ref 4.0–10.5)

## 2012-07-14 MED ORDER — PANTOPRAZOLE SODIUM 40 MG PO TBEC
40.0000 mg | DELAYED_RELEASE_TABLET | Freq: Every day | ORAL | Status: DC
  Administered 2012-07-14 – 2012-07-20 (×7): 40 mg via ORAL
  Filled 2012-07-14 (×7): qty 1

## 2012-07-14 MED ORDER — NICOTINE 14 MG/24HR TD PT24
14.0000 mg | MEDICATED_PATCH | Freq: Every day | TRANSDERMAL | Status: DC
  Administered 2012-07-14 – 2012-07-19 (×6): 14 mg via TRANSDERMAL
  Filled 2012-07-14 (×7): qty 1

## 2012-07-14 MED ORDER — POTASSIUM CHLORIDE CRYS ER 20 MEQ PO TBCR
40.0000 meq | EXTENDED_RELEASE_TABLET | Freq: Two times a day (BID) | ORAL | Status: AC
  Administered 2012-07-14 – 2012-07-16 (×6): 40 meq via ORAL
  Filled 2012-07-14 (×6): qty 2

## 2012-07-14 MED ORDER — INFLUENZA VIRUS VACC SPLIT PF IM SUSP
0.5000 mL | INTRAMUSCULAR | Status: AC
Start: 2012-07-15 — End: 2012-07-16
  Filled 2012-07-14: qty 0.5

## 2012-07-14 NOTE — Progress Notes (Signed)
  Subjective: Still sore from surgical priocedure yesterday. Drain in place;125ml from right drain 90ml from left drain.  Objective: Vital signs in last 24 hours: Temp:  [97.9 F (36.6 C)-98.5 F (36.9 C)] 97.9 F (36.6 C) (12/26 0627) Pulse Rate:  [52-82] 60  (12/26 0627) Resp:  [14-20] 18  (12/26 0627) BP: (103-132)/(57-85) 103/57 mmHg (12/26 0627) SpO2:  [97 %-100 %] 97 % (12/26 0627) Weight:  [232 lb (105.235 kg)] 232 lb (105.235 kg) (12/25 1507) Last BM Date: 07/13/12  Intake/Output from previous day: 12/25 0701 - 12/26 0700 In: 1391 [P.O.:240; I.V.:1151] Out: 200 [Drains:200] Intake/Output this shift: Total I/O In: -  Out: 40 [Drains:40]  General appearance: alert, cooperative, appears stated age and no distress Bilateral drains in anterior aspect of chest;some erythema present on left, tender to palpation. No odor noted from wound. Labs: WBC has trended down, H&H stable, Hypokalemic (repleting) VSS, afebrile  Lab Results:   [facility] 07/14/12 0655 07/13/12 1234  WBC 8.3 9.1  HGB 11.7* 12.3  HCT 34.6* 37.0  PLT 354 389   BMET  Basename 07/14/12 0655 07/13/12 1234  NA 136 134*  K 3.1* 3.3*  CL 101 97  CO2 26 25  GLUCOSE 116* 101*  BUN 8 8  CREATININE 0.73 0.69  CALCIUM 8.4 8.9   PT/INR No results found for this basename: LABPROT:2,INR:2 in the last 72 hours ABG No results found for this basename: PHART:2,PCO2:2,PO2:2,HCO3:2 in the last 72 hours  Studies/Results: No results found.  Anti-infectives: Anti-infectives     Start     Dose/Rate Route Frequency Ordered Stop   07/14/12 0500   vancomycin (VANCOCIN) IVPB 1000 mg/200 mL premix        1,000 mg 200 mL/hr over 60 Minutes Intravenous Every 12 hours 07/13/12 1543     07/13/12 1700   vancomycin (VANCOCIN) 2,000 mg in sodium chloride 0.9 % 500 mL IVPB        2,000 mg 250 mL/hr over 120 Minutes Intravenous  Once 07/13/12 1543 07/13/12 1939          Assessment/Plan:  Patient Active Problem  List  Diagnosis  . Breast CA  . Kidney carcinoma  . Breast pain in female  Hypokalemia (repleting)  Plan: 1. Arrange HHN for dressing changes  2.Replete K+ 3.Continue with wound care and abx (patient will go home on po abx) 4. Pain management 5. Home soon?  s/p * No surgery found *   LOS: 1 day    Blenda [facility]s [facility] Surgery Pager # [phone] 07/14/2012

## 2012-07-14 NOTE — Care Management Note (Signed)
  Page 2 of 2   07/19/2012     11:51:27 AM   CARE MANAGEMENT NOTE 07/19/2012  Patient:  [name], [name]   Account Number:  [id number]  Date Initiated:  07/14/2012  Documentation initiated by:  Ronny Flurry  Subjective/Objective Assessment:   DX: infection s/p mastectomy     Action/Plan:   plan to discharge to home on PO antibiotics and HHRN for wet to dry dressing changes   Anticipated DC Date:  07/20/2012   Anticipated DC Plan:  HOME W HOME HEALTH SERVICES  In-house referral  Financial Counselor         Choice offered to / List presented to:  C-1 Patient        HH arranged  HH-1 RN  HH-6 SOCIAL WORKER      HH agency  [facility]   Status of service:  Completed, signed off Medicare Important Message given?   (If response is "NO", the following Medicare IM given date fields will be blank) Date Medicare IM given:   Date Additional Medicare IM given:    Discharge Disposition:    Per UR Regulation:  Reviewed for med. necessity/level of care/duration of stay  If discussed at Long Length of Stay Meetings, dates discussed:    Comments:  07-19-12 Patient will be discharged to home tomorrow 07-20-12 with wet to dry dressing . [facility] will send a RN to patietn's home tomorrow to apply NWPT . Patient aware.  Ronny Flurry RN BSN [phone]     07-14-12 Facesheet information confirmed .  Patient states her daughter can assist with dressing changes.  Patient has applied for disability .  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  Ronny Flurry RN BSN [phone]

## 2012-07-14 NOTE — Progress Notes (Signed)
Pt told Rn that she was going to go outside to smoke. I explained to patient that we are a smoke free campus and that she was not allowed to leave the floor without a doctor order. Pt said that she was leaving the floor anyway and that Dr. Dwain Sarna told her that she could leave as long as she wasn't by herself. Rn called Md on call and notified him. He instructed me to tell the patient not to leave as I did and patient still left and said she would be right back. Night nurse aware that patient is off the floor.

## 2012-07-14 NOTE — Progress Notes (Signed)
[facility]  Patient Status: New  [facility] is providing the following services: RN  If patient discharges after hours, please call [phone].   [name] 07/14/2012, 12:19 PM

## 2012-07-14 NOTE — Progress Notes (Signed)
Agree with A&P of JD,[name]. Patient feels OK. IV access becoming an issue, but hopefully needs only one more day of IV vanc and then go home on PO meds

## 2012-07-15 LAB — BASIC METABOLIC PANEL
CO2: 24 mEq/L (ref 19–32)
Calcium: 8.5 mg/dL (ref 8.4–10.5)
Chloride: 104 mEq/L (ref 96–112)
Creatinine, Ser: 0.68 mg/dL (ref 0.50–1.10)
GFR calc Af Amer: >90 mL/min (ref >90)
Sodium: 137 mEq/L (ref 135–145)

## 2012-07-15 MED ORDER — OXYCODONE HCL 5 MG PO TABS
10.0000 mg | ORAL_TABLET | ORAL | Status: DC | PRN
  Administered 2012-07-15 – 2012-07-17 (×8): 20 mg via ORAL
  Administered 2012-07-18: 10 mg via ORAL
  Administered 2012-07-18 – 2012-07-19 (×4): 20 mg via ORAL
  Filled 2012-07-15: qty 4
  Filled 2012-07-15: qty 2
  Filled 2012-07-15 (×4): qty 4
  Filled 2012-07-15 (×2): qty 2
  Filled 2012-07-15 (×6): qty 4

## 2012-07-15 MED ORDER — HYDROMORPHONE HCL PF 1 MG/ML IJ SOLN
1.0000 mg | INTRAMUSCULAR | Status: DC | PRN
  Administered 2012-07-15 – 2012-07-19 (×18): 1 mg via INTRAVENOUS
  Filled 2012-07-15 (×20): qty 1

## 2012-07-15 MED ORDER — HYDROMORPHONE HCL PF 1 MG/ML IJ SOLN
1.0000 mg | INTRAMUSCULAR | Status: DC | PRN
  Administered 2012-07-15: 1.5 mg via INTRAVENOUS
  Filled 2012-07-15: qty 2

## 2012-07-15 MED ORDER — OXYCODONE HCL ER 10 MG PO T12A
10.0000 mg | EXTENDED_RELEASE_TABLET | Freq: Two times a day (BID) | ORAL | Status: DC
  Administered 2012-07-15 – 2012-07-20 (×11): 10 mg via ORAL
  Filled 2012-07-15 (×11): qty 1

## 2012-07-15 MED ORDER — LEVOFLOXACIN 750 MG PO TABS
750.0000 mg | ORAL_TABLET | Freq: Every day | ORAL | Status: DC
  Administered 2012-07-16 – 2012-07-20 (×5): 750 mg via ORAL
  Filled 2012-07-15 (×7): qty 1

## 2012-07-15 NOTE — Progress Notes (Signed)
Pt apparently left the floor without Rn knowing. I went to talk with her about it and she said that I said that it was ok. I never said that she could leave the floor or that it was ok to do so. I spoke with Earney Hamburg PA about situation. I instructed to tell patient that she could not leave the floor without her family present and if she did that she would have to be sign AMA papers. Patient denies to PA that she is smoking but told Rn that she was. Patient said that she will be compliant and not leave the floor again without family present. Will continue to monitor.

## 2012-07-15 NOTE — Progress Notes (Signed)
Agree with A&P of MJ,PA. Wound may need more debridement. Still c/o pain some nausea with PO med for pain

## 2012-07-15 NOTE — Progress Notes (Signed)
Patient ID: [name], female   DOB: 1966/04/11, 46 y.o.   MRN: [id number]   LOS: 2 days  POD#2  Subjective: Pain is her main issue.   Objective: Vital signs in last 24 hours: Temp:  [97.9 F (36.6 C)-99 F (37.2 C)] 99 F (37.2 C) (12/27 0520) Pulse Rate:  [61-90] 63  (12/27 0520) Resp:  [18-20] 18  (12/27 0520) BP: (90-123)/(43-109) 111/64 mmHg (12/27 0520) SpO2:  [94 %-97 %] 97 % (12/27 0520) Last BM Date: 07/14/12   Left JP: 153ml/24h (purulent) Right JP: 82ml/24h (serosanguinous with some mucopurulent material)   Lab Results:  BMET  Basename 07/15/12 0638 07/14/12 0655  NA 137 136  K 4.0 3.1*  CL 104 101  CO2 24 26  GLUCOSE 122* 116*  BUN 10 8  CREATININE 0.68 0.73  CALCIUM 8.5 8.4    General appearance: alert and no distress Resp: clear to auscultation bilaterally Chest wall: Left wound unpacked, minimal odor but purulent discharge on gauze. Some mild areas of necrosis along incision line. Cardio: regular rate and rhythm GI: normal findings: bowel sounds normal and soft, non-tender   Assessment/Plan: Breast ca s/p bilateral total mastectomies w/wound infection -- Will change abx to oral (Levaquin). Add long-acting oxycodone to help with pain. Could likely d/c when tolerating meds.   Freeman Caldron, PA-C Pager: [phone]   07/15/2012

## 2012-07-16 MED ORDER — BACITRACIN-NEOMYCIN-POLYMYXIN 400-5-5000 EX OINT
TOPICAL_OINTMENT | CUTANEOUS | Status: AC
  Administered 2012-07-16: 09:00:00
  Filled 2012-07-16: qty 2

## 2012-07-16 NOTE — Progress Notes (Signed)
   Assessment: s/p  Patient Active Problem List  Diagnosis  . Breast CA  . Kidney carcinoma  . Breast pain in female    Stable post op exam right mastectomy Left mastectomy site now opened up   Plan: Will continue current wound care, BID dressing changes, prob d/c Monday with home health  Subjective: Still c/o pain, but apparently has also been off the floor to smoke. Has been counseled about this already  Objective: Vital signs in last 24 hours: Temp:  [98.4 F (36.9 C)-99.4 F (37.4 C)] 98.5 F (36.9 C) (12/28 0610) Pulse Rate:  [73-92] 74  (12/28 0610) Resp:  [18] 18  (12/28 0610) BP: (95-122)/(47-72) 95/47 mmHg (12/28 0610) SpO2:  [97 %-100 %] 97 % (12/28 0610)   Intake/Output from previous day: 12/27 0701 - 12/28 0700 In: 480 [P.O.:480] Out: 165 [Drains:165] Intake/Output this shift:     General appearance: alert, cooperative and no distress  Incision: Right mastectomy scar is intact and healing OK;left flap has separated and drain is visible in wound . Removed drain. There is a 1x5 cm area of necrotic skin medial part of superior flap > sharply debrided. Two remaining subQ sutures removed so wound now widely open and drained.  Lab Results:   [facility] 07/14/12 0655 07/13/12 1234  WBC 8.3 9.1  HGB 11.7* 12.3  HCT 34.6* 37.0  PLT 354 389   BMET  Basename 07/15/12 0638 07/14/12 0655  NA 137 136  K 4.0 3.1*  CL 104 101  CO2 24 26  GLUCOSE 122* 116*  BUN 10 8  CREATININE 0.68 0.73  CALCIUM 8.5 8.4   PT/INR No results found for this basename: LABPROT:2,INR:2 in the last 72 hours ABG No results found for this basename: PHART:2,PCO2:2,PO2:2,HCO3:2 in the last 72 hours  MEDS, Scheduled    . amLODipine  10 mg Oral QAC breakfast  . heparin  5,000 Units Subcutaneous Q8H  . influenza  inactive virus vaccine  0.5 mL Intramuscular Tomorrow-1000  . levofloxacin  750 mg Oral Daily  . lisinopril  10 mg Oral QAC breakfast  .  neomycin-bacitracin-polymyxin      . nicotine  14 mg Transdermal Daily  . OxyCODONE  10 mg Oral Q12H  . pantoprazole  40 mg Oral Daily  . potassium chloride  40 mEq Oral BID    Studies/Results: No results found.    LOS: 3 days     Currie Paris, MD, [facility] Surgery, [place] [phone]   07/16/2012 9:20 AM

## 2012-07-17 MED ORDER — POLYETHYLENE GLYCOL 3350 17 G PO PACK
17.0000 g | PACK | Freq: Every day | ORAL | Status: DC
  Administered 2012-07-17 – 2012-07-19 (×3): 17 g via ORAL
  Filled 2012-07-17 (×4): qty 1

## 2012-07-17 NOTE — Progress Notes (Signed)
   Assessment: s/p  Patient Active Problem List  Diagnosis  . Breast CA  . Kidney carcinoma  . Breast pain in female    Wound improved slightly, now that it is open. JP on right now slowed and can be removed  Plan: Continue BID dressing changes, ask wound team to see re VAC, right JP pulled  Subjective: Still with pain, no other issues  Objective: Vital signs in last 24 hours: Temp:  [97.8 F (36.6 C)-99.1 F (37.3 C)] 97.8 F (36.6 C) (12/29 0500) Pulse Rate:  [64-89] 64  (12/29 0500) Resp:  [18-20] 18  (12/29 0500) BP: (99-112)/(49-59) 99/49 mmHg (12/29 0500) SpO2:  [93 %-98 %] 93 % (12/29 0500)   Intake/Output from previous day: 12/28 0701 - 12/29 0700 In: 440 [P.O.:440] Out: 40 [Drains:40] Intake/Output this shift:     General appearance: alert, cooperative and no distress  Incision: Right mastectomy incision healing nicely, nop evidence of infection: left open, cleaner but still draining cloudy fluid.  Lab Results:  No results found for this basename: WBC:2,HGB:2,HCT:2,PLT:2 in the last 72 hours BMET  [facility] 07/15/12 0638  NA 137  K 4.0  CL 104  CO2 24  GLUCOSE 122*  BUN 10  CREATININE 0.68  CALCIUM 8.5   PT/INR No results found for this basename: LABPROT:2,INR:2 in the last 72 hours ABG No results found for this basename: PHART:2,PCO2:2,PO2:2,HCO3:2 in the last 72 hours  MEDS, Scheduled    . amLODipine  10 mg Oral QAC breakfast  . heparin  5,000 Units Subcutaneous Q8H  . levofloxacin  750 mg Oral Daily  . lisinopril  10 mg Oral QAC breakfast  . nicotine  14 mg Transdermal Daily  . OxyCODONE  10 mg Oral Q12H  . pantoprazole  40 mg Oral Daily    Studies/Results: No results found.    LOS: 4 days     Currie Paris, MD, [facility] Surgery, [place] [phone]   07/17/2012 8:51 AM

## 2012-07-18 MED ORDER — HYDROMORPHONE HCL PF 1 MG/ML IJ SOLN
1.0000 mg | Freq: Once | INTRAMUSCULAR | Status: DC
  Filled 2012-07-18 (×7): qty 1

## 2012-07-18 NOTE — Progress Notes (Signed)
   Assessment: s/p  Patient Active Problem List  Diagnosis  . Breast CA  . Kidney carcinoma  . Breast pain in female    Open left mastectomy wound, VAC Seromam right mastectomy wound  Plan: VAC placed on left by wound team. Under steril dressings I opend the lateral portion of the right incision and drained about 50 cc sero-sanguinouos fluid. There is a lateral pocked, but the flaps appear adhered mediallly. Will to w>d on this.She needs to go home soon, but need to arrange home health and VAC  Subjective: Still feels bad, pain. Eating OK  Objective: Vital signs in last 24 hours: Temp:  [98 F (36.7 C)-99.6 F (37.6 C)] 98 F (36.7 C) (12/30 0605) Pulse Rate:  [71-81] 71  (12/30 0605) Resp:  [17-20] 17  (12/30 0605) BP: (91-99)/(47-54) 92/54 mmHg (12/30 0605) SpO2:  [92 %-96 %] 92 % (12/30 0605)   Intake/Output from previous day: 12/29 0701 - 12/30 0700 In: 480 [P.O.:480] Out: -  Intake/Output this shift: Total I/O In: 600 [P.O.:600] Out: -    General appearance: alert, cooperative and no distress  Incision: Left wound open and will benefit from [facility]. Examined wound with WC nurse and VAC ordered;Right side has accumulated fluid after drain removed yesterday and I think will benefit from being opend  Lab Results:  No results found for this basename: WBC:2,HGB:2,HCT:2,PLT:2 in the last 72 hours BMET No results found for this basename: NA:2,K:2,CL:2,CO2:2,GLUCOSE:2,BUN:2,CREATININE:2,CALCIUM:2 in the last 72 hours PT/INR No results found for this basename: LABPROT:2,INR:2 in the last 72 hours ABG No results found for this basename: PHART:2,PCO2:2,PO2:2,HCO3:2 in the last 72 hours  MEDS, Scheduled    . amLODipine  10 mg Oral QAC breakfast  . heparin  5,000 Units Subcutaneous Q8H  . levofloxacin  750 mg Oral Daily  . lisinopril  10 mg Oral QAC breakfast  . nicotine  14 mg Transdermal Daily  . OxyCODONE  10 mg Oral Q12H  . pantoprazole  40 mg Oral Daily  .  polyethylene glycol  17 g Oral Daily    Studies/Results: No results found.    LOS: 5 days     Currie Paris, MD, [facility] Surgery, [place] [phone]   07/18/2012 11:53 AM

## 2012-07-18 NOTE — Consult Note (Signed)
WOC consult Note Reason for Consult: placement of NPWT (VAC), left mastectomy site. Evaluated the pt with Dr. Jamey Ripa at the beside this am, he would like for me to place [facility] if possible.  He will be evaluating the right side later today and may open this side as well. I will bridge the two sites together later should this be needed.  Pt received IV and PO pain medication prior to placement, an as well scheduled anxiety meds. Wound type: surgical s/p debridement Measurement:13cm x 3cm x 2cm undermining from 4 o'clock to o'clock, 6cm tunnel at 3 o'clock   Wound bed: pink, moist, no granulation tissue, with some necrosis of the wound edge at 11 o'clock  Drainage (amount, consistency, odor) serous on dressing, and some fluid collection at the pocket near the axilla. Periwound: some erythema not severe Dressing procedure/placement/frequency: 1pc of black granufoam cut with shelf to allow this to fall down under the undermining at wound edges and down into the pocket at 6 oclock.  WOC will follow along with you for VAC changes and possible bridging of the other site.  Mahkayla Preece Maize RN,CWOCN [phone]

## 2012-07-19 ENCOUNTER — Telehealth (INDEPENDENT_AMBULATORY_CARE_PROVIDER_SITE_OTHER): Payer: Self-pay | Admitting: General Surgery

## 2012-07-19 MED ORDER — POLYETHYLENE GLYCOL 3350 17 G PO PACK
17.0000 g | PACK | Freq: Every day | ORAL | Status: DC
  Administered 2012-07-20: 17 g via ORAL
  Filled 2012-07-19: qty 1

## 2012-07-19 MED ORDER — BISACODYL 10 MG RE SUPP
10.0000 mg | Freq: Once | RECTAL | Status: AC
  Administered 2012-07-19: 10 mg via RECTAL
  Filled 2012-07-19 (×2): qty 1

## 2012-07-19 MED ORDER — BISACODYL 5 MG PO TBEC: 5.0000 mg | DELAYED_RELEASE_TABLET | Freq: Every day | ORAL | Status: DC | PRN

## 2012-07-19 MED ORDER — HYDROMORPHONE HCL PF 1 MG/ML IJ SOLN
1.0000 mg | Freq: Once | INTRAMUSCULAR | Status: AC
  Administered 2012-07-19: 1 mg via INTRAVENOUS
  Filled 2012-07-19: qty 1

## 2012-07-19 MED ORDER — HYDROMORPHONE HCL PF 1 MG/ML IJ SOLN
1.0000 mg | INTRAMUSCULAR | Status: DC | PRN
  Administered 2012-07-19 – 2012-07-20 (×10): 1 mg via INTRAVENOUS
  Filled 2012-07-19 (×4): qty 1

## 2012-07-19 MED ORDER — LEVOFLOXACIN 750 MG PO TABS: 750.0000 mg | ORAL_TABLET | Freq: Every day | ORAL | Status: DC

## 2012-07-19 NOTE — Progress Notes (Signed)
Consistently approached pt to change wound.  Pt either had no pain control or was not available in room. Communicated to oncoming nurse that dressing was unable to be changed during day shift.

## 2012-07-19 NOTE — Consult Note (Signed)
Discussed patients need for home NPWT with CM, will write orders for normal saline dressing to be utilized for discharge to home, then [facility] to hook patient up to NPWT once home.    Burns Timson [place] RN,CWOCN [phone]

## 2012-07-19 NOTE — Telephone Encounter (Signed)
LMOM letting pt know that she has an appt with Dr. Dwain Sarna on 1/3 at 11:30.

## 2012-07-19 NOTE — Progress Notes (Signed)
<  principal problem not specified>  Assessment: <principal problem not specified> Seems clinically improved although still feels "bad."  Plan: Will d/c iv today. Needs [place] home when we have appropriate home health arrangements for wound management. Will keep on antibiotic after discharge.   Subjective: "Bad" night feels bad allover, had a "chill". VAC seems tohave helped on Left, moderated drainage reported on right  Objective: Vital signs in last 24 hours: Temp:  [97.7 F (36.5 C)-102.4 F (39.1 C)] 98.8 F (37.1 C) (12/31 0558) Pulse Rate:  [67-83] 67  (12/31 0558) Resp:  [18-20] 18  (12/31 0558) BP: (80-115)/(46-59) 83/46 mmHg (12/31 0558) SpO2:  [90 %-98 %] 90 % (12/31 0558) Last BM Date: 07/14/12  Intake/Output from previous day: 12/30 0701 - 12/31 0700 In: 1200 [P.O.:1200] Out: 45 [Drains:45] Intake/Output this shift:    General appearance: alert, cooperative, no distress and moderately obese VAC in place - serous drainage Draining from right as well, but should resolve with local wound care  Lab Results:  No results found for this or any previous visit (from the past 24 hour(s)).   Studies/Results Radiology     MEDS, Scheduled    . amLODipine  10 mg Oral QAC breakfast  . heparin  5,000 Units Subcutaneous Q8H  .  HYDROmorphone (DILAUDID) injection  1 mg Intravenous Once  . levofloxacin  750 mg Oral Daily  . lisinopril  10 mg Oral QAC breakfast  . nicotine  14 mg Transdermal Daily  . OxyCODONE  10 mg Oral Q12H  . pantoprazole  40 mg Oral Daily  . polyethylene glycol  17 g Oral Daily       LOS: 6 days    Currie Paris, MD, [facility] Surgery, [place] [phone]   07/19/2012 7:44 AM

## 2012-07-20 MED ORDER — OXYCODONE HCL 15 MG PO TABS: 15.0000 mg | ORAL_TABLET | ORAL | Status: DC | PRN

## 2012-07-20 MED ORDER — LEVOFLOXACIN 750 MG PO TABS: 750.0000 mg | ORAL_TABLET | Freq: Every day | ORAL | Status: DC

## 2012-07-20 MED ORDER — INFLUENZA VIRUS VACC SPLIT PF IM SUSP
0.5000 mL | Freq: Once | INTRAMUSCULAR | Status: AC
  Administered 2012-07-20: 0.5 mL via INTRAMUSCULAR
  Filled 2012-07-20: qty 0.5

## 2012-07-20 NOTE — Progress Notes (Signed)
  Subjective: Comfortable Still leaving to smoke  Objective: Vital signs in last 24 hours: Temp:  [98.2 F (36.8 C)-98.4 F (36.9 C)] 98.4 F (36.9 C) (01/01 0620) Pulse Rate:  [67-79] 67  (01/01 0620) Resp:  [14-18] 18  (01/01 0620) BP: (99-127)/(51-73) 127/56 mmHg (01/01 0818) SpO2:  [94 %-96 %] 94 % (01/01 0620) Last BM Date: 07/14/12  Intake/Output from previous day: 12/31 0701 - 01/01 0700 In: 480 [P.O.:480] Out: -  Intake/Output this shift:    Wounds stable   Lab Results:  No results found for this basename: WBC:2,HGB:2,HCT:2,PLT:2 in the last 72 hours BMET No results found for this basename: NA:2,K:2,CL:2,CO2:2,GLUCOSE:2,BUN:2,CREATININE:2,CALCIUM:2 in the last 72 hours PT/INR No results found for this basename: LABPROT:2,INR:2 in the last 72 hours ABG No results found for this basename: PHART:2,PCO2:2,PO2:2,HCO3:2 in the last 72 hours  Studies/Results: No results found.  Anti-infectives: Anti-infectives     Start     Dose/Rate Route Frequency Ordered Stop   07/19/12 0000   levofloxacin (LEVAQUIN) 750 MG tablet        750 mg Oral Daily 07/19/12 0757     07/15/12 1100   levofloxacin (LEVAQUIN) tablet 750 mg        750 mg Oral Daily 07/15/12 1012     07/14/12 0500   vancomycin (VANCOCIN) IVPB 1000 mg/200 mL premix  Status:  Discontinued        1,000 mg 200 mL/hr over 60 Minutes Intravenous Every 12 hours 07/13/12 1543 07/15/12 1012   07/13/12 1700   vancomycin (VANCOCIN) 2,000 mg in sodium chloride 0.9 % 500 mL IVPB        2,000 mg 250 mL/hr over 120 Minutes Intravenous  Once 07/13/12 1543 07/13/12 1939          Assessment/Plan: s/p * No surgery found *  Discharge today  LOS: 7 days    Kalub Morillo A 07/20/2012

## 2012-07-21 NOTE — Progress Notes (Signed)
Discharge instructions reviewed with patient. Aware of plan for Home VAC. Per family, machine already delivered to home and number to call when patient arrives home. Questions answered re: constipation and pain meds.Patient able to verbalize date and time of f/u appointment with Dr. Dwain Sarna. Medicated for pain prior to discharge home per patient request.  Printed AVS  And prescriptions given to patient. Discharged home via wheelchair. Accompanied by husband.

## 2012-07-22 ENCOUNTER — Encounter (INDEPENDENT_AMBULATORY_CARE_PROVIDER_SITE_OTHER): Payer: Self-pay | Admitting: General Surgery

## 2012-07-22 ENCOUNTER — Ambulatory Visit (INDEPENDENT_AMBULATORY_CARE_PROVIDER_SITE_OTHER): Payer: Self-pay | Admitting: General Surgery

## 2012-07-22 VITALS — BP 152/96 | HR 76 | Temp 98.8°F | Resp 16 | Ht 64.0 in | Wt 232.0 lb

## 2012-07-22 DIAGNOSIS — N644 Mastodynia: Secondary | ICD-10-CM

## 2012-07-22 DIAGNOSIS — Z09 Encounter for follow-up examination after completed treatment for conditions other than malignant neoplasm: Secondary | ICD-10-CM

## 2012-07-22 MED ORDER — OXYCODONE HCL 15 MG PO TABS: 15.0000 mg | ORAL_TABLET | ORAL | Status: DC | PRN

## 2012-07-25 ENCOUNTER — Telehealth (INDEPENDENT_AMBULATORY_CARE_PROVIDER_SITE_OTHER): Payer: Self-pay | Admitting: General Surgery

## 2012-07-25 ENCOUNTER — Other Ambulatory Visit: Payer: Self-pay | Admitting: *Deleted

## 2012-07-25 NOTE — Telephone Encounter (Signed)
Pt's daughter [name]) called from her mother's home.  She states pt cries with uncontrolled pain, especially with wound care.  She is taking Oxycodone 15 mg and augmenting it with ibuprofen.  Is there anything else to try for pain control.  Pleased advise.

## 2012-07-25 NOTE — Telephone Encounter (Signed)
Received a call from pt stating that Dr. Doreen Salvage nurse called her this morning & told her that he has cleared her from getting any more refills from him because she was on Medicaid. Repeated back what she said for clarification and she stated "Dr Dwain Sarna now wants all of my medication coming from one doctor. I still have a vac back on but said I was cleared to go back to Dr Myna Hidalgo for medication". Her surgery was on Dec 10th. Asked if she was out of her pain medication and she stated "I am but I can wait until tomorrow to pick up my prescriptions". Explained that this would have to be reviewed & authorized by Dr Myna Hidalgo 1st. This message along with notes from Dr Doreen Salvage office given to Dr Myna Hidalgo to review. Oxycodone 15 mg # 40 tabs was given to the pt on 07/22/12.

## 2012-07-25 NOTE — Progress Notes (Signed)
Subjective:     Patient ID: Howard Pouch, female   DOB: 07-15-1966, 47 y.o.   MRN: [id number]  HPI This is a 47 year old female who underwent bilateral mastectomies recently. This was prophylactic on right and on left she had prior cancer with shrunken hard irradiated breast that was difficulty to examine.  This has been complicated by infection on left that has resulted in open wound with vac being placed. She has significant pain at both sites that is difficult to control and existed preop as well.  The left side is undergoing vac changes.  The right side was also opened laterally and is being packed. She states she has some nausea and her pain is difficult to control also.  Review of Systems     Objective:   Physical Exam Right mastectomy incision clean without infection, open laterally with clean base and good granulation tissue in cavity, repacked Left side with viable flaps now and vac in place draining serous fluid. No infection    Assessment:     S/p bilateral mastectomy    Plan:     I think her infections have been managed adequately now.  She has very low pain tolerance and I did refill meds again today.  I cautioned her again on taking so much as well as not taking her xanax with all of this.  Will continue dressing changes and give this time.

## 2012-07-25 NOTE — Telephone Encounter (Signed)
Pt (herself) called back within minutes of daughter's call.  She stated she was to see Dr. Myna Hidalgo and would ask him to assume the problem of pain control for her.  She stated "we all knew that pain would be a problem this time around and he can take care of it for me."  FYI

## 2012-07-25 NOTE — Telephone Encounter (Signed)
Called to let pt know that Rx are ready to pick up from the front desk and appt on Monday, 08/01/12, at 8:45 am for recheck of wound.  Rx for Fentanyl patch 25 mcg, # 4, 1 patch Q3D no refill and Rx for Oxycodone 10 mg, # 40 (forty), 1 tab po Q6H prn pain, no refill issued by Dr. Dwain Sarna.

## 2012-07-25 NOTE — Telephone Encounter (Signed)
Tanya from [facility] called to let us know that she visited this patient this morning to change her wound vac dressing and the patient wanted a refill on her pain medication.  She explained to the nurse that she has had to take more due to the amount of pain she is in and that if she is not given the Rx then she "will find a way to get it".  I explained to Kenney Houseman that we gave oxycodone 15 mg 1 Q4H prn #40 on 07/22/12 but that I would send this message to Dr. Dwain Sarna while he is in the office to get his perspective on this issue.

## 2012-07-26 ENCOUNTER — Telehealth (INDEPENDENT_AMBULATORY_CARE_PROVIDER_SITE_OTHER): Payer: Self-pay | Admitting: General Surgery

## 2012-07-26 NOTE — Telephone Encounter (Signed)
Patient came into the office today stating her RX for phentanyl patch was written for #4 and they come in a box of #5 and the pharmacy will not fill it. Dr Magnus Ivan signed a new RX for #5 and patient encouraged to take motrin along with her pain medication.

## 2012-07-27 ENCOUNTER — Telehealth (INDEPENDENT_AMBULATORY_CARE_PROVIDER_SITE_OTHER): Payer: Self-pay | Admitting: General Surgery

## 2012-07-27 NOTE — Telephone Encounter (Signed)
Victorino Dike with [facility] called to let us know the breast wound is tunneling and they are afraid they are not getting the black foam packed into the tunnels enough. She wants to know if it is okay to switch to AND gauze with the [employer] wound vac so that it is adequately packed. Please advise if this is okay. She also has a new place on the right breast that is draining well she wanted to make you aware of. Victorino Dike can be reached at [phone].

## 2012-07-28 ENCOUNTER — Telehealth (INDEPENDENT_AMBULATORY_CARE_PROVIDER_SITE_OTHER): Payer: Self-pay

## 2012-07-28 NOTE — Telephone Encounter (Signed)
Is she seeing me on Monday?

## 2012-07-28 NOTE — Telephone Encounter (Signed)
Yes- she is scheduled to come in on Monday 08/01/12.

## 2012-07-28 NOTE — Telephone Encounter (Signed)
LMOM stating that Dr Dwain Sarna said it was ok to switch the sponge on the wound vac.

## 2012-07-29 ENCOUNTER — Telehealth (INDEPENDENT_AMBULATORY_CARE_PROVIDER_SITE_OTHER): Payer: Self-pay | Admitting: General Surgery

## 2012-07-29 NOTE — Telephone Encounter (Signed)
Pt's daughter, Stephannie Peters, called to report the home health nurse today recommended she may need a second wound vac.  The other side of her wound is open, held together by a narrow piece of skin and has foul smell.  Pt has appt early on Monday with Dr. Dwain Sarna and is on antibiotics now.  Advised daughter that Dr. Dwain Sarna is not available to page, so continue same care for wound for the time being and physician will reassess needs on Monday.  They understands.

## 2012-07-30 ENCOUNTER — Observation Stay (HOSPITAL_COMMUNITY)
Admission: EM | Admit: 2012-07-30 | Discharge: 2012-08-01 | Disposition: A | Discharge status: Home / Self Care | Payer: Self-pay | Attending: General Surgery | Admitting: General Surgery

## 2012-07-30 ENCOUNTER — Encounter (HOSPITAL_COMMUNITY): Payer: Self-pay | Admitting: Emergency Medicine

## 2012-07-30 DIAGNOSIS — Z85528 Personal history of other malignant neoplasm of kidney: Secondary | ICD-10-CM | POA: Insufficient documentation

## 2012-07-30 DIAGNOSIS — F411 Generalized anxiety disorder: Secondary | ICD-10-CM | POA: Insufficient documentation

## 2012-07-30 DIAGNOSIS — N644 Mastodynia: Secondary | ICD-10-CM

## 2012-07-30 DIAGNOSIS — Z853 Personal history of malignant neoplasm of breast: Secondary | ICD-10-CM | POA: Insufficient documentation

## 2012-07-30 DIAGNOSIS — C649 Malignant neoplasm of unspecified kidney, except renal pelvis: Secondary | ICD-10-CM

## 2012-07-30 DIAGNOSIS — C50919 Malignant neoplasm of unspecified site of unspecified female breast: Secondary | ICD-10-CM

## 2012-07-30 DIAGNOSIS — G8928 Other chronic postprocedural pain: Principal | ICD-10-CM | POA: Insufficient documentation

## 2012-07-30 DIAGNOSIS — F329 Major depressive disorder, single episode, unspecified: Secondary | ICD-10-CM

## 2012-07-30 DIAGNOSIS — Z9010 Acquired absence of unspecified breast and nipple: Secondary | ICD-10-CM | POA: Insufficient documentation

## 2012-07-30 LAB — COMPREHENSIVE METABOLIC PANEL
ALT: 8 U/L (ref 0–35)
AST: 15 U/L (ref 0–37)
CO2: 22 mEq/L (ref 19–32)
Calcium: 9.4 mg/dL (ref 8.4–10.5)
Chloride: 100 mEq/L (ref 96–112)
GFR calc Af Amer: >90 mL/min (ref >90)
GFR calc non Af Amer: >90 mL/min (ref >90)
Glucose, Bld: 106 mg/dL — ABNORMAL HIGH (ref 70–99)
Sodium: 138 mEq/L (ref 135–145)
Total Bilirubin: 0.2 mg/dL — ABNORMAL LOW (ref 0.3–1.2)

## 2012-07-30 LAB — CBC WITH DIFFERENTIAL/PLATELET
Basophils Absolute: 0 10*3/uL (ref 0.0–0.1)
Eosinophils Relative: 6 % — ABNORMAL HIGH (ref 0–5)
HCT: 39.1 % (ref 36.0–46.0)
Lymphocytes Relative: 24 % (ref 12–46)
Lymphs Abs: 2.5 10*3/uL (ref 0.7–4.0)
MCV: 90.9 fL (ref 78.0–100.0)
Monocytes Absolute: 0.6 10*3/uL (ref 0.1–1.0)
Neutro Abs: 6.8 10*3/uL (ref 1.7–7.7)
Platelets: 320 10*3/uL (ref 150–400)
RBC: 4.3 MIL/uL (ref 3.87–5.11)
RDW: 13 % (ref 11.5–15.5)
WBC: 10.6 10*3/uL — ABNORMAL HIGH (ref 4.0–10.5)

## 2012-07-30 MED ORDER — CIPROFLOXACIN HCL 500 MG PO TABS
500.0000 mg | ORAL_TABLET | Freq: Two times a day (BID) | ORAL | Status: DC
  Administered 2012-07-30 – 2012-08-01 (×4): 500 mg via ORAL
  Filled 2012-07-30 (×6): qty 1

## 2012-07-30 MED ORDER — AMLODIPINE BESYLATE 10 MG PO TABS
10.0000 mg | ORAL_TABLET | Freq: Every day | ORAL | Status: DC
  Administered 2012-07-31 – 2012-08-01 (×2): 10 mg via ORAL
  Filled 2012-07-30 (×4): qty 1

## 2012-07-30 MED ORDER — OXYCODONE HCL 5 MG PO TABS: 15.0000 mg | ORAL_TABLET | ORAL | Status: DC | PRN

## 2012-07-30 MED ORDER — ONDANSETRON HCL 4 MG/2ML IJ SOLN
4.0000 mg | Freq: Four times a day (QID) | INTRAMUSCULAR | Status: DC | PRN
  Administered 2012-07-31 – 2012-08-01 (×4): 4 mg via INTRAVENOUS
  Filled 2012-07-30 (×4): qty 2

## 2012-07-30 MED ORDER — OXYCODONE-ACETAMINOPHEN 5-325 MG PO TABS
2.0000 | ORAL_TABLET | Freq: Once | ORAL | Status: AC
  Administered 2012-07-30: 2 via ORAL
  Filled 2012-07-30: qty 2

## 2012-07-30 MED ORDER — ENOXAPARIN SODIUM 40 MG/0.4ML ~~LOC~~ SOLN
40.0000 mg | Freq: Every day | SUBCUTANEOUS | Status: DC
  Administered 2012-07-31 – 2012-08-01 (×2): 40 mg via SUBCUTANEOUS
  Filled 2012-07-30 (×3): qty 0.4

## 2012-07-30 MED ORDER — OXYCODONE HCL 5 MG PO TABS
15.0000 mg | ORAL_TABLET | ORAL | Status: DC | PRN
  Administered 2012-07-31 – 2012-08-01 (×7): 15 mg via ORAL
  Filled 2012-07-30 (×7): qty 3

## 2012-07-30 MED ORDER — HYDROMORPHONE HCL PF 1 MG/ML IJ SOLN
1.0000 mg | INTRAMUSCULAR | Status: DC | PRN
  Administered 2012-07-31 – 2012-08-01 (×6): 1 mg via INTRAVENOUS
  Filled 2012-07-30 (×6): qty 1

## 2012-07-30 MED ORDER — ALPRAZOLAM 0.5 MG PO TABS
1.0000 mg | ORAL_TABLET | Freq: Three times a day (TID) | ORAL | Status: DC | PRN
  Administered 2012-07-31 – 2012-08-01 (×5): 1 mg via ORAL
  Filled 2012-07-30: qty 1
  Filled 2012-07-30 (×2): qty 2
  Filled 2012-07-30: qty 1
  Filled 2012-07-30 (×2): qty 2
  Filled 2012-07-30: qty 1
  Filled 2012-07-30: qty 2

## 2012-07-30 MED ORDER — DEXTROSE-NACL 5-0.45 % IV SOLN
INTRAVENOUS | Status: DC
  Administered 2012-07-31: 20 mL/h via INTRAVENOUS

## 2012-07-30 MED ORDER — PANTOPRAZOLE SODIUM 20 MG PO TBEC
20.0000 mg | DELAYED_RELEASE_TABLET | Freq: Every day | ORAL | Status: DC
  Administered 2012-07-31 – 2012-08-01 (×2): 20 mg via ORAL
  Filled 2012-07-30 (×2): qty 1

## 2012-07-30 MED ORDER — LISINOPRIL 10 MG PO TABS
10.0000 mg | ORAL_TABLET | Freq: Every day | ORAL | Status: DC
  Administered 2012-07-31 – 2012-08-01 (×2): 10 mg via ORAL
  Filled 2012-07-30 (×3): qty 1

## 2012-07-30 NOTE — ED Notes (Signed)
I gave the patient a cup of ice and a sprite. 

## 2012-07-30 NOTE — H&P (Signed)
[name] is an 47 y.o. female.   Chief Complaint: foul smelling drainage from mastectomy wounds HPI: Pt 1 month s/p bilateral mastectomy by Dr Dwain Sarna.  She has had poor wound healing and previous admission for wound care 2 weeks ago.  HHN noticed more drainage and opening of the incision on the right,  Wound vac on the left was changed to a gauze wound vac on Friday?  For reasons unknown.  Pt became concerned and came to ED.  Has chronic pain issues.  Very anxious at this point.  Past Medical History  Diagnosis Date  . Arthritis   . Chronic kidney disease   . Hypertension   . COPD (chronic obstructive pulmonary disease)   . GERD (gastroesophageal reflux disease)   . Breast pain in female 07/03/2011  . Asthma     mild  . Anxiety   . Seizures 20 years ago    x1  . Cancer     kidney and breast  . Breast CA 07/03/2011    left  . 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 PONV (postoperative nausea and vomiting)     hard to wake up  . Tendinitis     both hands to elbows    Past Surgical History  Procedure Date  . Bladder suspension 2012  . Bladder surgery 2012    bladder stretched  . Kidney surgery 2011    right kidney cancer 1/2 kidney removed   . Cholecystectomy 1985  . Foot surgery     right  . Oophorectomy     left  . Cesarean section   . Nasal sinus surgery   . Bunionectomy   . Breast surgery 2008-2009    left breast 3  lymph nodes  . Breast surgery 2008-2009    left breast lumpectomy   . Abdominal hysterectomy at 47 years old    partial  . Cardiac catheterization 2012  . Total mastectomy 06/28/2012    Procedure: TOTAL MASTECTOMY;  Surgeon: Emelia Loron, MD;  Location: [facility];  Service: General;  Laterality: Bilateral;    Family History  Problem Relation Age of Onset  . Kidney disease Father   . Cancer Mother     breast  . Cancer Paternal Aunt     unaware of 1, and 2nd had breast   Social History:  reports that she has been smoking  Cigarettes.  She has a 10 pack-year smoking history. She has never used smokeless tobacco. She reports that she does not drink alcohol or use illicit drugs.  Allergies:  Allergies  Allergen Reactions  . Penicillins Anaphylaxis and Hives  . Sulfur Anaphylaxis and Hives  . Darvocet (Propoxyphene-Acetaminophen)     Nausea, hives, and itching    . Doxycycline Nausea And Vomiting  . Morphine And Related Nausea Only  . Naproxen Nausea And Vomiting  . Nsaids Nausea And Vomiting  . Tape Other (See Comments)    Rips skin  . Toradol (Ketorolac Tromethamine) Nausea And Vomiting  . Ultracet (Tramadol-Acetaminophen) Hives, Itching and Nausea And Vomiting  . Ultram (Tramadol Hcl)     Nausea, hives, itching      (Not in a hospital admission)  Results for orders placed during the hospital encounter of 07/30/12 (from the past 48 hour(s))  CBC WITH DIFFERENTIAL     Status: Abnormal   Collection Time   07/30/12  7:36 PM      Component Value Range Comment   WBC 10.6 (*) 4.0 -  10.5 K/uL    RBC 4.30  3.87 - 5.11 MIL/uL    Hemoglobin 13.3  12.0 - 15.0 g/dL    HCT 16.1  09.6 - 04.5 %    MCV 90.9  78.0 - 100.0 fL    MCH 30.9  26.0 - 34.0 pg    MCHC 34.0  30.0 - 36.0 g/dL    RDW 40.9  81.1 - 91.4 %    Platelets 320  150 - 400 K/uL SPECIMEN CHECKED FOR CLOTS   Neutrophils Relative 64  43 - 77 %    Neutro Abs 6.8  1.7 - 7.7 K/uL    Lymphocytes Relative 24  12 - 46 %    Lymphs Abs 2.5  0.7 - 4.0 K/uL    Monocytes Relative 6  3 - 12 %    Monocytes Absolute 0.6  0.1 - 1.0 K/uL    Eosinophils Relative 6 (*) 0 - 5 %    Eosinophils Absolute 0.6  0.0 - 0.7 K/uL    Basophils Relative 0  0 - 1 %    Basophils Absolute 0.0  0.0 - 0.1 K/uL   COMPREHENSIVE METABOLIC PANEL     Status: Abnormal   Collection Time   07/30/12  7:36 PM      Component Value Range Comment   Sodium 138  135 - 145 mEq/L    Potassium 3.4 (*) 3.5 - 5.1 mEq/L    Chloride 100  96 - 112 mEq/L    CO2 22  19 - 32 mEq/L    Glucose, Bld  106 (*) 70 - 99 mg/dL    BUN 8  6 - 23 mg/dL    Creatinine, Ser 7.82  0.50 - 1.10 mg/dL    Calcium 9.4  8.4 - 95.6 mg/dL    Total Protein 7.4  6.0 - 8.3 g/dL    Albumin 3.3 (*) 3.5 - 5.2 g/dL    AST 15  0 - 37 U/L    ALT 8  0 - 35 U/L    Alkaline Phosphatase 82  39 - 117 U/L    Total Bilirubin 0.2 (*) 0.3 - 1.2 mg/dL    GFR calc non Af Amer >90  >90 mL/min    GFR calc Af Amer >90  >90 mL/min    No results found.  Review of Systems  Constitutional: Positive for fever, chills and malaise/fatigue.  HENT: Negative.   Respiratory: Negative for cough.   Genitourinary: Negative for dysuria.  Musculoskeletal: Positive for myalgias.  Skin: Negative.   Neurological: Positive for weakness.  Psychiatric/Behavioral: Positive for depression. The patient is nervous/anxious.     Blood pressure 137/96, pulse 60, temperature 98.1 F (36.7 C), temperature source Oral, resp. rate 16, SpO2 100.00%. Physical Exam  Respiratory:    Skin:        Assessment/Plan S/p bilateral mastectomy with bilateral open wounds and poor pain control with suboptimal wound care at home with [facility] Admit for wound care and pain control Needs a sponge vac since fluid build up under sponge vac not acceptable wound care No signs of invasive infection at this point but wounds have regressed and will need inpatient treatment at this point. Will let Dr Dwain Sarna know about admission   Yanet Balliet A. 07/30/2012, 9:09 PM

## 2012-07-30 NOTE — ED Notes (Signed)
Attempt times 2 to start IV without success.

## 2012-07-30 NOTE — ED Notes (Addendum)
PT. REPORTS PROGRESSING RIGHT BREAST INFECTION AT INCISION SITE ( BILATERAL MASTECTOMY) WITH DRAINAGE /ODOR , BILATERAL MASTECTOMY LAST DEC. 10,2013 , SCHEDULED FOR FOLLOW - UP APPOINTMENT WITH DR. Lessie Dings ON Monday. DENIES FEVER OR CHILLS.

## 2012-07-30 NOTE — ED Notes (Signed)
Dr Radford Pax in to see patient .  Patient had double mastectomy on 12/10 and was released on 12/13.  She noticed some infection to the left side and was back in the hospital on 12/25 to 1/1.  Home health nurse noticed that the right side opened further and has been unable to put packing into the wound.  Stated the drainage from the right wound has a terrific smell.  The drain that was attached to the left side has started draining small amount of drainage today.  Patient tearful as well as her daughter.

## 2012-07-30 NOTE — ED Notes (Signed)
Dr. Lavenia Atlas in to see patient.  Removed dressing to the right breast and removed the suction to the left breast.  Dr. Slightly packed wounds ans ABD 's applied

## 2012-07-31 LAB — CBC
Hemoglobin: 12.3 g/dL (ref 12.0–15.0)
MCH: 31.4 pg (ref 26.0–34.0)
MCV: 90.8 fL (ref 78.0–100.0)
Platelets: 356 10*3/uL (ref 150–400)
RBC: 3.92 MIL/uL (ref 3.87–5.11)
WBC: 10.5 10*3/uL (ref 4.0–10.5)

## 2012-07-31 LAB — BASIC METABOLIC PANEL
CO2: 22 mEq/L (ref 19–32)
Calcium: 9 mg/dL (ref 8.4–10.5)
Chloride: 99 mEq/L (ref 96–112)
Glucose, Bld: 114 mg/dL — ABNORMAL HIGH (ref 70–99)
Sodium: 136 mEq/L (ref 135–145)

## 2012-07-31 NOTE — Progress Notes (Signed)
General surgery attending note:  I've interviewed and examined this patient this morning. I agree with the assessment and treatment plan outlined  By Clance Boll, PA.  One large open wound left breast with lots of granulation tissue but fairly moist. No significant saline this. Right breast with 3 separate wounds packed. Minimal cellulitis.  For now, change dressings wet-to-dry once a shift. Oral Cipro Discharge home once wound care stabilized Dr. Dwain Sarna to assume care tomorrow.   Angelia Mould. Derrell Lolling, M.D., [facility] General and Minimally invasive Surgery Breast and Colorectal Surgery Office:   [phone] Pager:   [phone]

## 2012-07-31 NOTE — Progress Notes (Signed)
Patient ID: [name], female   DOB: Oct 30, 1965, 47 y.o.   MRN: [id number]    Subjective: Having a lot of pain in right wounds but controlled with oral meds for the most part, no other complaints, IV infiltrated this am  Objective: Vital signs in last 24 hours: Temp:  [98.1 F (36.7 C)-98.4 F (36.9 C)] 98.4 F (36.9 C) (01/12 0235) Pulse Rate:  [58-66] 66  (01/12 0235) Resp:  [16-19] 18  (01/12 0235) BP: (102-149)/(55-96) 102/55 mmHg (01/12 0235) SpO2:  [97 %-100 %] 97 % (01/12 0235) Weight:  [232 lb (105.235 kg)] 232 lb (105.235 kg) (01/11 2310) Last BM Date: 07/30/12  Intake/Output from previous day: 01/11 0701 - 01/12 0700 In: 702 [P.O.:600; I.V.:102] Out: -  Intake/Output this shift:    PE: Chest: left breast wound with beefy red tissue in base with some exudate, no erythema, some drainage on dressings, right breast wounds healthy appearing with some serous drainage General: awake and alert, no acute distress  Lab Results:   Basename 07/30/12 2322 07/30/12 1936  WBC 10.5 10.6*  HGB 12.3 13.3  HCT 35.6* 39.1  PLT 356 320   BMET  Basename 07/30/12 2322 07/30/12 1936  NA 136 138  K 3.1* 3.4*  CL 99 100  CO2 22 22  GLUCOSE 114* 106*  BUN 7 8  CREATININE 0.63 0.60  CALCIUM 9.0 9.4   PT/INR No results found for this basename: LABPROT:2,INR:2 in the last 72 hours CMP     Component Value Date/Time   NA 136 07/30/2012 2322   K 3.1* 07/30/2012 2322   CL 99 07/30/2012 2322   CO2 22 07/30/2012 2322   GLUCOSE 114* 07/30/2012 2322   BUN 7 07/30/2012 2322   CREATININE 0.63 07/30/2012 2322   CALCIUM 9.0 07/30/2012 2322   PROT 7.4 07/30/2012 1936   ALBUMIN 3.3* 07/30/2012 1936   AST 15 07/30/2012 1936   ALT 8 07/30/2012 1936   ALKPHOS 82 07/30/2012 1936   BILITOT 0.2* 07/30/2012 1936   GFRNONAA >90 07/30/2012 2322   GFRAA >90 07/30/2012 2322   Lipase     Component Value Date/Time   LIPASE 36 09/01/2010 1000       Studies/Results: No results  found.  Anti-infectives: Anti-infectives     Start     Dose/Rate Route Frequency Ordered Stop   07/30/12 2359   ciprofloxacin (CIPRO) tablet 500 mg        500 mg Oral 2 times daily 07/30/12 2310             Assessment/Plan 1. Bilateral mastectomy with bilateral open wounds and pain: pain better, wounds overall healthy.  --dressing changes bid and prn  --Cipro po  --lovenox for dvt prophylaxis  --up out of bed and ambulate  --Dr. Dwain Sarna notified of patient's admission and will see the patient tomorrow.   LOS: 1 day    Aylan Bayona 07/31/2012

## 2012-07-31 NOTE — Progress Notes (Signed)
Utilization review completed.  

## 2012-08-01 ENCOUNTER — Ambulatory Visit: Payer: Self-pay | Admitting: Hematology & Oncology

## 2012-08-01 ENCOUNTER — Encounter (INDEPENDENT_AMBULATORY_CARE_PROVIDER_SITE_OTHER): Payer: Self-pay | Admitting: General Surgery

## 2012-08-01 ENCOUNTER — Telehealth: Payer: Self-pay | Admitting: Hematology & Oncology

## 2012-08-01 ENCOUNTER — Other Ambulatory Visit: Payer: Self-pay | Admitting: Lab

## 2012-08-01 MED ORDER — OXYCODONE HCL 15 MG PO TABS: 15.0000 mg | ORAL_TABLET | ORAL | Status: DC | PRN

## 2012-08-01 MED ORDER — HYDROMORPHONE HCL PF 1 MG/ML IJ SOLN
0.5000 mg | INTRAMUSCULAR | Status: DC | PRN
  Administered 2012-08-01 (×2): 1 mg via INTRAVENOUS
  Filled 2012-08-01 (×2): qty 1

## 2012-08-01 NOTE — Care Management Note (Signed)
  Page 1 of 1   08/01/2012     10:08:03 AM   CARE MANAGEMENT NOTE 08/01/2012  Patient:  MAKAILEY, HODGKIN   Account Number:  [id number]  Date Initiated:  08/01/2012  Documentation initiated by:  Ronny Flurry  Subjective/Objective Assessment:     Action/Plan:   Anticipated DC Date:  08/01/2012   Anticipated DC Plan:  HOME W HOME HEALTH SERVICES         Choice offered to / List presented to:  C-1 Patient        HH arranged  HH-1 RN      [facility] agency  [facility]   Status of service:   Medicare Important Message given?   (If response is "NO", the following Medicare IM given date fields will be blank) Date Medicare IM given:   Date Additional Medicare IM given:    Discharge Disposition:    Per UR Regulation:    If discussed at Long Length of Stay Meetings, dates discussed:    Comments:  08-01-12 [facility] set up for discharge today . Patient has [facility] Negative Wound Pressure System at home . Patient would be discharged today with wet to dry dressing to left and [facility] would come to her home this afternoon to apply  Negative Wound Pressure System at the latest they would come tomorrow am . Britta Mccreedy at DR [facility] office aware.  Ronny Flurry RN BSN [phone]

## 2012-08-01 NOTE — Telephone Encounter (Signed)
Dr.Ennever called and cx patient's apt due to patient being in the hospital

## 2012-08-01 NOTE — Progress Notes (Signed)
  Subjective: She complains of pain esp with dressing changes, otherwise no nausea, tol diet, ambulating  Objective: Vital signs in last 24 hours: Temp:  [97.9 F (36.6 C)-99 F (37.2 C)] 98.2 F (36.8 C) (01/13 0548) Pulse Rate:  [61-83] 68  (01/13 0548) Resp:  [16-19] 17  (01/13 0548) BP: (98-110)/(48-60) 100/60 mmHg (01/13 0548) SpO2:  [95 %-99 %] 95 % (01/13 0548) Last BM Date: 07/30/12  Intake/Output from previous day: 01/12 0701 - 01/13 0700 In: 1400 [P.O.:1180; I.V.:220] Out: -  Intake/Output this shift:    Incision/Wound:left mastectomy with open granulating wound and tunnel under axilla but no infection Right side with three draining areas with no purulence or infection, there is some exudate but otherwise healthy flaps and tissue  Lab Results:   [facility] 07/30/12 2322 07/30/12 1936  WBC 10.5 10.6*  HGB 12.3 13.3  HCT 35.6* 39.1  PLT 356 320   BMET  Basename 07/30/12 2322 07/30/12 1936  NA 136 138  K 3.1* 3.4*  CL 99 100  CO2 22 22  GLUCOSE 114* 106*  BUN 7 8  CREATININE 0.63 0.60  CALCIUM 9.0 9.4   Assessment/Plan: S/p bilateral mastectomies with open wound on left, several draining areas on right   I think she can be discharged today if home health setup again.  She needs vac on left tucked back towards axilla and then bid wet to dry dressing changes on right.  If setup please call and I will discharge.  I will plan on seeing weekly. I don't even think she needs abx when she goes home now. [facility] 08/01/2012

## 2012-08-01 NOTE — Consult Note (Signed)
WOC consult Note Reason for Consult: for [facility] placement, however pt to be discharged today with [facility] to resume [employer] NPWT device with black foam.  I have verified with hospital [facility] coordinator that the dressing should remain black foam, she will make sure orders placed for this. Spoke with CM, the pt does have [employer] device in the room, however no dressing kits or drainage canisters.  I have requested one for delivery before 11am, however this can not occur. This WOC nurse must leave for coverage for [facility] campus.  My partner is not familiar with the placement of this device.  CM awaiting ok for pt to dc with normal saline dressing packing and then have HHRN for placement of NPWT device later today or am.  CM will verify orders for this. I have discussed with bedside nursing as well, and notified the pt.  Dajia Gunnels [place] RN,CWOCN [phone]

## 2012-08-01 NOTE — Progress Notes (Signed)
Dr Dwain Sarna came to visit patient and bring prescription for home pain meds.  Pt agreeable to discharge.  Discussed discharge instructions and follow-up information with patient.  She verbalized understanding of all instructions.  [facility] was notified at 1650 that patient was definitely going home this evening and they are set up to place her VAC dressing either tonight or first thing tomorrow.  Dr Dwain Sarna notified RN that pt could be given one more dose of pain meds before being discharged tonight.  When pt's ride arrives, will follow-through with that instruction.  Will cont to monitor.

## 2012-08-02 NOTE — ED Provider Notes (Signed)
History     CSN: [id number]  Arrival date & time 07/30/12  1842   First MD Initiated Contact with Patient 07/30/12 2019      No chief complaint on file.   HPI Patient had double mastectomy on 12/10 and was released on 12/13. She noticed some infection to the left side and was back in the hospital on 12/25 to 1/1. Home health nurse noticed that the right side opened further and has been unable to put packing into the wound. Stated the drainage from the right wound has a terrific smell. The drain that was attached to the left side has started draining small amount of drainage today. Patient tearful as well as her daughter.  Past Medical History  Diagnosis Date  . Arthritis   . Chronic kidney disease   . Hypertension   . COPD (chronic obstructive pulmonary disease)   . GERD (gastroesophageal reflux disease)   . Breast pain in female 07/03/2011  . Asthma     mild  . Anxiety   . Seizures 20 years ago    x1  . Cancer     kidney and breast  . Breast CA 07/03/2011    left  . 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 PONV (postoperative nausea and vomiting)     hard to wake up  . Tendinitis     both hands to elbows    Past Surgical History  Procedure Date  . Bladder suspension 2012  . Bladder surgery 2012    bladder stretched  . Kidney surgery 2011    right kidney cancer 1/2 kidney removed   . Cholecystectomy 1985  . Foot surgery     right  . Oophorectomy     left  . Cesarean section   . Nasal sinus surgery   . Bunionectomy   . Breast surgery 2008-2009    left breast 3  lymph nodes  . Breast surgery 2008-2009    left breast lumpectomy   . Abdominal hysterectomy at 47 years old    partial  . Cardiac catheterization 2012  . Total mastectomy 06/28/2012    Procedure: TOTAL MASTECTOMY;  Surgeon: Emelia Loron, MD;  Location: [facility];  Service: General;  Laterality: Bilateral;    Family History  Problem Relation Age of Onset  . Kidney disease Father   . Cancer  Mother     breast  . Cancer Paternal Aunt     unaware of 1, and 2nd had breast    History  Substance Use Topics  . Smoking status: Current Every Day Smoker -- 0.2 packs/day for 40 years    Types: Cigarettes    Last Attempt to Quit: 06/10/2012  . Smokeless tobacco: Never Used  . Alcohol Use: No    OB History    Grav Para Term Preterm Abortions TAB SAB Ect Mult Living                  Review of Systems All other systems reviewed and are negative Allergies  Penicillins; Sulfur; Darvocet; Doxycycline; Morphine and related; Naproxen; Nsaids; Tape; Toradol; Ultracet; and Ultram  Home Medications   Current Outpatient Rx  Name  Route  Sig  Dispense  Refill  . ALPRAZOLAM 1 MG PO TABS   Oral   Take 1 mg by mouth 3 (three) times daily as needed. For anxiety         . AMLODIPINE BESYLATE 10 MG PO TABS   Oral   Take 10  mg by mouth daily before breakfast.         . LANSOPRAZOLE 30 MG PO CPDR   Oral   Take 30 mg by mouth 2 (two) times daily.          Marland Kitchen LISINOPRIL 10 MG PO TABS   Oral   Take 10 mg by mouth daily before breakfast.          . OXYCODONE HCL 15 MG PO TABS   Oral   Take 15 mg by mouth every 4 (four) hours as needed. For pain         . OXYCODONE HCL 15 MG PO TABS   Oral   Take 1 tablet (15 mg total) by mouth every 4 (four) hours as needed.   40 tablet   0     BP 145/69  Pulse 89  Temp 98.4 F (36.9 C) (Oral)  Resp 19  Ht 5\' 4"  (1.626 m)  Wt 232 lb (105.235 kg)  BMI 39.82 kg/m2  SpO2 99%  Physical Exam  Nursing note and vitals reviewed. Constitutional: She is oriented to person, place, and time. She appears well-developed and well-nourished. No distress.  HENT:  Head: Normocephalic and atraumatic.  Eyes: Pupils are equal, round, and reactive to light.  Neck: Normal range of motion.  Cardiovascular: Normal rate and intact distal pulses.   Pulmonary/Chest: No respiratory distress.    Abdominal: Normal appearance. She exhibits no  distension.  Musculoskeletal: Normal range of motion.  Neurological: She is alert and oriented to person, place, and time. No cranial nerve deficit.  Skin: Skin is warm and dry. No rash noted.  Psychiatric: She has a normal mood and affect. Her behavior is normal.    ED Course  Procedures (including critical care time)  Medications  ciprofloxacin (CIPRO) 500 MG tablet (not administered)  levofloxacin (LEVAQUIN) 750 MG tablet (not administered)  oxyCODONE (ROXICODONE) 15 MG immediate release tablet (not administered)  oxyCODONE (ROXICODONE) 15 MG immediate release tablet (not administered)  oxyCODONE-acetaminophen (PERCOCET/ROXICET) 5-325 MG per tablet 2 tablet (2 tablet Oral Given 07/30/12 2048)     General surgery was consulted for evaluation Labs Reviewed  CBC WITH DIFFERENTIAL - Abnormal; Notable for the following:    WBC 10.6 (*)     Eosinophils Relative 6 (*)     All other components within normal limits  COMPREHENSIVE METABOLIC PANEL - Abnormal; Notable for the following:    Potassium 3.4 (*)     Glucose, Bld 106 (*)     Albumin 3.3 (*)     Total Bilirubin 0.2 (*)     All other components within normal limits  CBC - Abnormal; Notable for the following:    HCT 35.6 (*)     All other components within normal limits  BASIC METABOLIC PANEL - Abnormal; Notable for the following:    Potassium 3.1 (*)     Glucose, Bld 114 (*)     All other components within normal limits  LAB REPORT - SCANNED   No results found.   1. Breast pain in female   2. Breast CA   3. Kidney carcinoma   4. Anxiety and depression       MDM          Nelia Shi, MD 08/02/12 1106

## 2012-08-03 ENCOUNTER — Telehealth: Payer: Self-pay | Admitting: Hematology & Oncology

## 2012-08-03 ENCOUNTER — Telehealth (INDEPENDENT_AMBULATORY_CARE_PROVIDER_SITE_OTHER): Payer: Self-pay

## 2012-08-03 ENCOUNTER — Other Ambulatory Visit: Payer: Self-pay | Admitting: Hematology & Oncology

## 2012-08-03 DIAGNOSIS — C50919 Malignant neoplasm of unspecified site of unspecified female breast: Secondary | ICD-10-CM

## 2012-08-03 NOTE — Telephone Encounter (Signed)
Called Victorino Dike back to let her know that Dr Dwain Sarna said the pt has plenty of Oxy IR 15mg  to take q 4hrs along with anitinflammatory. The pt has denied fental patches so there is nothing else to really advise to the pt for pain. The pt was asking about a picc line to be put in by Dr Dwain Sarna and he said that is not an option. The pt really needs to take Advil or Ibuprofen or Aleve around the clock to help better with pain control. I made the pt a f/u appt with Dr Dwain Sarna for 08/09/12 arrive at 9:00 for 9:15am.

## 2012-08-03 NOTE — Discharge Summary (Signed)
Physician Discharge Summary  Patient ID: [name] MRN: [id number] DOB/AGE: 02-11-1966 47 y.o.  Admit date: 07/30/2012  Discharge date: 08/03/2012  Admission Diagnoses: Chronic pain Open mastectomy wounds bilateral Anxiety HTN  Discharge Diagnoses:  saa  Discharged Condition: fair  Hospital Course: She is 1 month s/p bilateral mastectomy. One side was s/p radiation and she is a smoker. She has had poor wound healing and previous admission for wound care 2 weeks ago. HHN noticed more drainage and opening of the incision on the right, wound vac on the left was changed to a gauze wound vac on Friday.  She was concerned about her wounds.  Pt became concerned and came to ED. Has chronic pain issues. Very anxious at this point.  She was admitted and her wounds have been cleaned up with dressing changes.  She has significant chronic pain issues and anxiety which are hard to manage.  She also continues to smoke.  She is going to be discharged again on oxycodone and we discussed antiinflammatories.  We have discussed longer acting pain meds and transdermal but she does not want to do this. I will plan on seeing her weekly.  Consults: wound care  Significant Diagnostic Studies: none  Treatments: antibiotics   Disposition: 01-Home or Self Care with home health     Medication List     As of 08/03/2012  9:16 AM    STOP taking these medications         ciprofloxacin 500 MG tablet   Commonly known as: CIPRO      levofloxacin 750 MG tablet   Commonly known as: LEVAQUIN      TAKE these medications         ALPRAZolam 1 MG tablet   Commonly known as: XANAX   Take 1 mg by mouth 3 (three) times daily as needed. For anxiety      amLODipine 10 MG tablet   Commonly known as: NORVASC   Take 10 mg by mouth daily before breakfast.      lisinopril 10 MG tablet   Commonly known as: PRINIVIL,ZESTRIL   Take 10 mg by mouth daily before breakfast.      oxyCODONE 15 MG immediate release  tablet   Commonly known as: ROXICODONE   Take 15 mg by mouth every 4 (four) hours as needed. For pain      oxyCODONE 15 MG immediate release tablet   Commonly known as: ROXICODONE   Take 1 tablet (15 mg total) by mouth every 4 (four) hours as needed.      PREVACID 30 MG capsule   Generic drug: lansoprazole   Take 30 mg by mouth 2 (two) times daily.           Follow-up Information    Follow up with [facility], [place]. In 1 week.   Contact information:   [address] [place] [address] [phone]          Signed: Emelia Loron 08/03/2012, 9:16 AM

## 2012-08-03 NOTE — Telephone Encounter (Signed)
Pt aware of 08-18-12 appointment

## 2012-08-03 NOTE — Telephone Encounter (Signed)
[name] w/[facility] calling in b/c the pt is requesting something for break thru pain and her f/u appt with Dr Dwain Sarna. I advised her that I would have to speak to him and get back in touch with her.

## 2012-08-09 ENCOUNTER — Ambulatory Visit (INDEPENDENT_AMBULATORY_CARE_PROVIDER_SITE_OTHER): Payer: Self-pay | Admitting: General Surgery

## 2012-08-09 ENCOUNTER — Encounter (INDEPENDENT_AMBULATORY_CARE_PROVIDER_SITE_OTHER): Payer: Self-pay | Admitting: General Surgery

## 2012-08-09 VITALS — BP 144/82 | HR 84 | Resp 16 | Ht 64.0 in | Wt 233.0 lb

## 2012-08-09 DIAGNOSIS — Z09 Encounter for follow-up examination after completed treatment for conditions other than malignant neoplasm: Secondary | ICD-10-CM

## 2012-08-09 MED ORDER — OXYCODONE HCL 15 MG PO TABS: 15.0000 mg | ORAL_TABLET | ORAL | Status: DC | PRN

## 2012-08-09 MED ORDER — ALPRAZOLAM 1 MG PO TABS: 1.0000 mg | ORAL_TABLET | Freq: Two times a day (BID) | ORAL | Status: DC | PRN

## 2012-08-09 NOTE — Progress Notes (Signed)
Subjective:     Patient ID: [name], female   DOB: [birth year], 47 y.o.   MRN: [id number]  HPI This is a 47 year old female who underwent bilateral mastectomies. This was prophylactic on right and on left she had prior cancer with shrunken hard irradiated breast that was difficulty to examine. This has been complicated by infection on left that has resulted in open wound with vac being placed. She has significant pain at both sites that is difficult to control and existed preop as well. The left side is undergoing vac changes. The right side was also opened  and is being packed. She looks much improved today.  The exam room smells of smoke and she is smoking again.  She has pain but is getting better.  She said the home nurse said she still had breast tissue on the left but I told her this is where it is open and swelling. There is no breast tissue over there.  She comes in today for wound check.  I removed the vac on left.   Review of Systems     Objective:   Physical Exam Right side with some edema laterally, three openings with no infection, granulation tissue present, wounds repacked only lateral wound is deep others are superficial Left side vac removed, granulation tissue present without infection, flaps are fine    Assessment:     S/p bilateral mastectomy    Plan:     I think continuing vac on left and right sided dressing changes is fine.  There is no infection now.  I refilled pain meds and gave her 10 xanax.  I again discussed smoking.  She does not want to pursue help for this.  The right side will hopefully heal and the left we will have to follow.  As I told her prior to this I didn't think this would cure her chronic pain and we would have trouble due to xrt/smoking.

## 2012-08-15 ENCOUNTER — Telehealth (INDEPENDENT_AMBULATORY_CARE_PROVIDER_SITE_OTHER): Payer: Self-pay

## 2012-08-15 ENCOUNTER — Telehealth (INDEPENDENT_AMBULATORY_CARE_PROVIDER_SITE_OTHER): Payer: Self-pay | Admitting: General Surgery
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
## 2012-08-15 NOTE — Telephone Encounter (Signed)
Called pt to talk to her about the several messages she has left today about her appt that she can't keep on Wednesday with Dr Dwain Sarna b/c her ride has a court date. I advised pt that Dr Dwain Sarna is going to open up some office time for Thursday afternoon and we can see her at 3:30. The pt stated she has an appt with Dr Myna Hidalgo Thursday 1/30 at 11:30 so I told her she would still have time to see Dr Dwain Sarna but the pt wanted to see him tomorrow. I advised pt that DR Dwain Sarna is in surgery tomorrow but he is opening up time for Thursday to see her so either she would have to keep the appt for Wednesday or change to Thursday. The pt stated she would come Thursday but she wants Dr Dwain Sarna to communicate with Dr Myna Hidalgo that it's ok for him to start prescribing her medication again. I told pt that I would relay this message to Dr Dwain Sarna. The pt requested pain medicine on one of the messages left today so I did get a written Rx for Oxycodone for her to p/u at the front desk but the pt said she didn't have a ride to p/u the rx. The pt was instructed to take the wound vac off today and do wet to dry dressing changes until the pt see's Dr Dwain Sarna this week. I advised pt that I would call the home health nurse next to talk about the wound vac. I will call the pt back.
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
## 2012-08-17 ENCOUNTER — Encounter (INDEPENDENT_AMBULATORY_CARE_PROVIDER_SITE_OTHER): Payer: Self-pay | Admitting: General Surgery

## 2012-08-18 ENCOUNTER — Other Ambulatory Visit (HOSPITAL_BASED_OUTPATIENT_CLINIC_OR_DEPARTMENT_OTHER): Payer: Self-pay | Admitting: Lab

## 2012-08-18 ENCOUNTER — Encounter (INDEPENDENT_AMBULATORY_CARE_PROVIDER_SITE_OTHER): Payer: Self-pay | Admitting: General Surgery

## 2012-08-18 ENCOUNTER — Ambulatory Visit (HOSPITAL_BASED_OUTPATIENT_CLINIC_OR_DEPARTMENT_OTHER): Payer: Self-pay | Admitting: Hematology & Oncology

## 2012-08-18 VITALS — BP 131/73 | HR 87 | Temp 99.0°F | Resp 18 | Ht 64.0 in | Wt 230.0 lb

## 2012-08-18 DIAGNOSIS — N644 Mastodynia: Secondary | ICD-10-CM

## 2012-08-18 DIAGNOSIS — C50419 Malignant neoplasm of upper-outer quadrant of unspecified female breast: Secondary | ICD-10-CM

## 2012-08-18 DIAGNOSIS — F17200 Nicotine dependence, unspecified, uncomplicated: Secondary | ICD-10-CM

## 2012-08-18 DIAGNOSIS — C50919 Malignant neoplasm of unspecified site of unspecified female breast: Secondary | ICD-10-CM

## 2012-08-18 LAB — CBC WITH DIFFERENTIAL (CANCER CENTER ONLY)
BASO#: 0 10*3/uL (ref 0.0–0.2)
EOS%: 2.4 % (ref 0.0–7.0)
HCT: 39.6 % (ref 34.8–46.6)
HGB: 13.5 g/dL (ref 11.6–15.9)
LYMPH#: 1.8 10*3/uL (ref 0.9–3.3)
LYMPH%: 22.9 % (ref 14.0–48.0)
MCH: 30.7 pg (ref 26.0–34.0)
MCHC: 34.1 g/dL (ref 32.0–36.0)
MCV: 90 fL (ref 81–101)
NEUT%: 65.6 % (ref 39.6–80.0)
RDW: 13.4 % (ref 11.1–15.7)

## 2012-08-18 LAB — COMPREHENSIVE METABOLIC PANEL
AST: 15 U/L (ref 0–37)
BUN: 8 mg/dL (ref 6–23)
Calcium: 9.4 mg/dL (ref 8.4–10.5)
Chloride: 101 mEq/L (ref 96–112)
Creatinine, Ser: 0.64 mg/dL (ref 0.50–1.10)
Total Bilirubin: 0.5 mg/dL (ref 0.3–1.2)

## 2012-08-18 MED ORDER — OXYCODONE HCL 15 MG PO TABS: 15.0000 mg | ORAL_TABLET | ORAL | Status: DC | PRN

## 2012-08-18 MED ORDER — ALPRAZOLAM 1 MG PO TABS: 1.0000 mg | ORAL_TABLET | Freq: Two times a day (BID) | ORAL | Status: DC | PRN

## 2012-08-18 NOTE — Progress Notes (Signed)
This office note has been dictated.
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
## 2012-08-19 NOTE — Progress Notes (Signed)
DIAGNOSES: 1. 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 2. 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 3. The patient is status post bilateral mastectomies for pain.  CURRENT THERAPY:  Observation.  INTERIM HISTORY:  [name] comes in for followup.  She finally had her mastectomies.  She had these I think back in December.  She has had some postop complications with infection, wound infections.  This hopefully is improving.  She still has some open surgical incisions. Her chest wall is bandaged up right now.  She has some fatigue.  She is still smoking.  She is going to try to stop this.  I told her this is probably the best thing that she could do in order to improve the wound healing.  She was recently hospitalized in January for another wound infection.  She has had no fever.  She has had no bleeding.  There has been no nausea or vomiting.  She has had some urinary issues but does not wish to have anything done for this.  She does have a hernia on the right lateral abdominal wall.  This will also be observed.  PHYSICAL EXAMINATION:  General:  This is a fairly well-developed, well- nourished white female in no obvious distress.  Vital signs:  Show a temperature of 99, pulse 87, respiratory rate 18, blood pressure 131/73. Weight is 230.  Head and neck:  Shows a normocephalic, atraumatic skull. There are no ocular or oral lesions.  There are no palpable cervical or supraclavicular lymph nodes.  Lungs:  Clear bilaterally.  Cardiac: Regular rate and rhythm with a normal S1 and S2.  There are no murmurs, rubs or bruits.  Chest wall:  Exam is with dressings and these were not taken off.  Abdomen:  Soft.  She has good bowel sounds.  There is no fluid wave.  There is no palpable hepatosplenomegaly.  Extremities: Show no clubbing, cyanosis or edema.  Neurological:  Shows no focal neurological deficits.  LABORATORY STUDIES:  White  cell count is 7.9, hemoglobin 13.5, hematocrit 39.6, platelet count is 288.  IMPRESSION:  [name] is a 46 year old white female with a past history of stage I ductal carcinoma of the left breast.  This, in my mind, is not an issue.  She could not afford adjuvant aromatase inhibitor therapy.  As such, she really was not placed on anything.  She has had the mastectomies now for pain control.  This hopefully will help with respect to pain.  We will plan to get her back in 1 month to see how she is doing. Hopefully, the dressings will be off and that the surgical wounds have healed.  Hopefully she will continue to cut back on her tobacco use.  We did go ahead and get her Xanax and oxycodone refilled today.  We do this every 2 weeks.    ______________________________ Josph Macho, M.D. PRE/MEDQ  D:  08/18/2012  T:  08/19/2012  Job:  [id number]

## 2012-08-22 NOTE — Discharge Summary (Signed)
Physician Discharge Summary  Patient ID: [name] MRN: [id number] DOB/AGE: Dec 13, 1965 47 y.o.  Admit date: 07/13/2012 Discharge date: 08/22/2012  Admission Diagnoses: S/p bilateral mastectomies History breast cancer Chronic pain  Discharge Diagnoses:  SAA  Discharged Condition: fair  Hospital Course: 67 yof I knew previously from care for left breast cancer treated with lumpectomy, sentinel node biopsy and xrt.  She has significant radiation changes to the left breast making it difficult to examine.  She also has desired bilateral mastectomies due to age.  We had long conversation as documented in prior notes about her high risk status for surgical issues specifically wound healing given her prior radiation and smoking history.  We ended up doing bilateral simple mastectomies which she has had a lot of difficulty from.  Her pain has been difficult to control.  The left side opened up and this has been treated with a vac sponge to the open wound. She came in due to pain and concern for infection.  She mostly just has open wounds at this point.  The right side has opened up partially as well. This was opened further this hospitalization by Dr. Jamey Ripa.  She spent the time in the hospital being treated for her pain issues as well as dressing changes until home health could be setup.  Consults: wound care  Significant Diagnostic Studies: none  Treatments: dressing changes, iv pain control, antibiotics    Disposition: 01-Home or Self Care  Discharge Orders    Future Appointments: Provider: Department: Dept Phone: Center:   09/13/2012 1:30 PM Emelia Loron, MD [facility] [phone] None   09/16/2012 9:45 AM Rachael Fee [facility] [phone] None   09/16/2012 10:15 AM Josph Macho, MD [facility] [phone] None     Future Orders Please Complete By Expires   Diet - low sodium heart healthy       Increase activity slowly      Discharge wound care:      Comments:   Right side should be re-packed daily and external dressing changed as needed. VAC should be changed Mon, Wed, & Fri       Medication List     As of 08/22/2012  2:08 PM    STOP taking these medications         oxyCODONE 15 MG immediate release tablet   Commonly known as: ROXICODONE      TAKE these medications         amLODipine 10 MG tablet   Commonly known as: NORVASC   Take 10 mg by mouth daily before breakfast.      lisinopril 10 MG tablet   Commonly known as: PRINIVIL,ZESTRIL   Take 10 mg by mouth daily before breakfast.      PREVACID 30 MG capsule   Generic drug: lansoprazole   Take 30 mg by mouth 2 (two) times daily.           Follow-up Information    Schedule an appointment as soon as possible for a visit with Emelia Loron, MD. (Friday)    Contact information:   [address] [place] [address] [phone]          Signed: Emelia Loron 08/22/2012, 2:08 PM

## 2012-08-23 NOTE — Telephone Encounter (Signed)
Called pt to check on her since she no showed for her appt last week with Dr Dwain Sarna on 08/18/12. The pt's daughter answered the phone and said the pt was sleeping so I discussed the pt's appt with her b/c she is the one who r/s the appt for 09/13/12. I advised her that we really need to see the pt this week b/c of the wounds getting wet to dry dressing changes. I made an appt for the pt to be seen this Friday 08/26/12 at 4:00. I advised them if the pt is going to be late she really better call b/c she is Dr Doreen Salvage last pt of his clinic and I don't know if he has plans after the clinic. The daughter will have pt call if she thinks she will be late. The daughter has been changing the dressing changes and she thinks the wound is getting much better. I advised the daughter to please have the pt come in this week. The daughter understands. The daughter wants to know if the pt can take a shower now. I will send Dr Dwain Sarna a message and get back in touch with them.

## 2012-08-26 ENCOUNTER — Encounter (INDEPENDENT_AMBULATORY_CARE_PROVIDER_SITE_OTHER): Payer: Self-pay | Admitting: General Surgery

## 2012-08-26 ENCOUNTER — Encounter: Payer: Self-pay | Admitting: *Deleted

## 2012-08-26 ENCOUNTER — Other Ambulatory Visit: Payer: Self-pay | Admitting: *Deleted

## 2012-08-26 ENCOUNTER — Encounter: Payer: Self-pay | Admitting: Hematology & Oncology

## 2012-08-26 ENCOUNTER — Ambulatory Visit (INDEPENDENT_AMBULATORY_CARE_PROVIDER_SITE_OTHER): Payer: Self-pay

## 2012-08-26 DIAGNOSIS — N644 Mastodynia: Secondary | ICD-10-CM

## 2012-08-26 DIAGNOSIS — T819XXA Unspecified complication of procedure, initial encounter: Secondary | ICD-10-CM

## 2012-08-26 MED ORDER — OXYCODONE HCL 15 MG PO TABS: 15.0000 mg | ORAL_TABLET | Freq: Three times a day (TID) | ORAL | Status: DC | PRN

## 2012-08-26 NOTE — Progress Notes (Signed)
Pt missed her appt this afternoon with Dr Dwain Sarna for her bilateral masty wound check. The pt wanted me to take a look at the wounds b/c her daughter has been doing wet to dry dressing changes on the wounds. I removed the bandages and took off the wet gauze both of the wounds look good. The pt stated she feels she has ripped the left wound opened more but i didn't see any evidence of the tear. I applied more wet to dry dressings on both wounds and covered with dry gauze. I recovered with ABD dressings with tape. I advised pt that she really didn't need such a big dressing anymore that she could make the dressings smaller if she wanted to to give her skin a break with all the tape she has been using on the wounds. The pt has been using large pieces of Tegraderm but I didn't have those kind of big pieces. The pt will ask Dr Dwain Sarna the next time about a script for the tegraderm. I made an appt for the pt on Monday at 9:00am to see Dr Dwain Sarna. The pt understands she really needs to see Dr Dwain Sarna b/c she has missed seeing him the last 2 weeks.

## 2012-08-26 NOTE — Telephone Encounter (Signed)
Pt left a message on the voicemail at 1029 (and called the front office staff at least 4 more times) stating "I need my oxycodone 15 mg this evening.Of note: her voice was very fast over the phone and had to repeat the message 3 times to obtain all of the information. Reviewed the rx. It was prescribed last week on the 08/18/12. Per Dr Myna Hidalgo, she is gets a rx every 2 weeks. Left a message on the pt's voicemail stating such and she will need to call next Thursday for a Friday request. Also explained that she is NOT to call multiple times for requests in a single day. It states such on the voicemail.  [name] return call at 1400 stating that when she received the rx last week the directions stated to take every 4 hours as needed. "I figured he did it because of my dressing changes". Asked her if she was taking it every 8 hours before and she said "yes". Also said "I thought there was something was off with the prescription that's why I am calling today". Asked why she didn't call when she received the rx and she stated "I was just in so much pain I didn't think about it". Reviewed with Dr. Myna Hidalgo. It was not his intention to give her a rx to take every 4 hours but since the directions did say such he will refill it this time but the agreement is still the same. She will have to come in herself today to pick up the rx as Dr. Myna Hidalgo wants her to sign a narcotic contract. [name] stated she would do so.

## 2012-08-26 NOTE — Telephone Encounter (Signed)
See previous telephone note. 

## 2012-08-29 ENCOUNTER — Encounter (INDEPENDENT_AMBULATORY_CARE_PROVIDER_SITE_OTHER): Payer: Self-pay | Admitting: General Surgery

## 2012-08-29 NOTE — Telephone Encounter (Signed)
LMOM stating that we can cancel the home health order on the pt b/c she has been a no show x3 with Dr Dwain Sarna for the last several weeks. The pt missed her appt today with Dr Dwain Sarna on 08/29/12.

## 2012-09-08 ENCOUNTER — Other Ambulatory Visit: Payer: Self-pay | Admitting: *Deleted

## 2012-09-08 DIAGNOSIS — N644 Mastodynia: Secondary | ICD-10-CM

## 2012-09-09 MED ORDER — ALPRAZOLAM 1 MG PO TABS: 1.0000 mg | ORAL_TABLET | Freq: Two times a day (BID) | ORAL | Status: DC | PRN

## 2012-09-09 MED ORDER — OXYCODONE HCL 15 MG PO TABS: 15.0000 mg | ORAL_TABLET | Freq: Three times a day (TID) | ORAL | Status: DC | PRN

## 2012-09-09 NOTE — Telephone Encounter (Signed)
Pt called yesterday asking to pick up her Xanax and Oxy-IR prescriptions today. After reviewing her chart, she is due for her 2 week refill. Will route to Dr Myna Hidalgo for approval & signature.

## 2012-09-13 ENCOUNTER — Encounter (INDEPENDENT_AMBULATORY_CARE_PROVIDER_SITE_OTHER): Payer: Self-pay | Admitting: General Surgery

## 2012-09-16 ENCOUNTER — Other Ambulatory Visit: Payer: Self-pay | Admitting: Lab

## 2012-09-16 ENCOUNTER — Ambulatory Visit: Payer: Self-pay | Admitting: Hematology & Oncology

## 2012-09-19 ENCOUNTER — Telehealth (INDEPENDENT_AMBULATORY_CARE_PROVIDER_SITE_OTHER): Payer: Self-pay

## 2012-09-19 NOTE — Telephone Encounter (Signed)
The patient called to clarify some of her appointment dates.  She knows about the one on 3/14.   She was unaware of the one on 2/25 with Dr Dwain Sarna.

## 2012-09-23 ENCOUNTER — Other Ambulatory Visit: Payer: Self-pay | Admitting: *Deleted

## 2012-09-23 DIAGNOSIS — N644 Mastodynia: Secondary | ICD-10-CM

## 2012-09-23 MED ORDER — ALPRAZOLAM 1 MG PO TABS: 1.0000 mg | ORAL_TABLET | Freq: Two times a day (BID) | ORAL | Status: DC | PRN

## 2012-09-23 MED ORDER — OXYCODONE HCL 15 MG PO TABS: 15.0000 mg | ORAL_TABLET | Freq: Three times a day (TID) | ORAL | Status: DC | PRN

## 2012-09-26 ENCOUNTER — Other Ambulatory Visit: Payer: Self-pay | Admitting: *Deleted

## 2012-09-27 ENCOUNTER — Other Ambulatory Visit: Payer: Self-pay | Admitting: Medical

## 2012-09-28 ENCOUNTER — Telehealth: Payer: Self-pay | Admitting: Hematology & Oncology

## 2012-09-28 NOTE — Telephone Encounter (Signed)
Moved 3-20 to 3-25 per PA left message

## 2012-09-30 ENCOUNTER — Encounter (INDEPENDENT_AMBULATORY_CARE_PROVIDER_SITE_OTHER): Payer: Self-pay | Admitting: General Surgery

## 2012-09-30 ENCOUNTER — Ambulatory Visit (INDEPENDENT_AMBULATORY_CARE_PROVIDER_SITE_OTHER): Payer: Self-pay | Admitting: General Surgery

## 2012-09-30 VITALS — BP 130/84 | HR 80 | Temp 98.2°F | Resp 18 | Ht 64.0 in | Wt 235.0 lb

## 2012-09-30 DIAGNOSIS — Z09 Encounter for follow-up examination after completed treatment for conditions other than malignant neoplasm: Secondary | ICD-10-CM

## 2012-09-30 DIAGNOSIS — K432 Incisional hernia without obstruction or gangrene: Secondary | ICD-10-CM

## 2012-10-03 NOTE — Progress Notes (Signed)
Subjective:     Patient ID: Howard Pouch, female   DOB: [birth year], 47 y.o.   MRN: [id number]  HPI 33 yof who underwent bilateral mastectomies.  I have not seen her in a while due to missed appts and her inability to come due to personal issues.  She had open wounds on both sides.  She really is doing pretty well now.  I told her before this that she would have problems and would not necessarily cure her issues.  She is mostly happy with things now but wounds are slow to heal due to her smoking, obesity and prior radiation on the left side.  She is able to move her arms.  She would very much like to have postmastectomy supplies now.  Review of Systems     Objective:   Physical Exam Left side with minimal open wound now without infection, I am very surprised this has healed Right side nearly healed, a lot of excess adiposity of edema after healing     Assessment:     S/p bilateral mastectomies     Plan:     She is surprisingly healing now.  Will continue current therapy and I will see back in a few weeks.  She will get medication from Dr Myna Hidalgo per their agreement.  I did give her rx for postmastectomy supplies today.

## 2012-10-06 ENCOUNTER — Other Ambulatory Visit: Payer: Self-pay | Admitting: Lab

## 2012-10-06 ENCOUNTER — Ambulatory Visit: Payer: Self-pay | Admitting: Medical

## 2012-10-07 ENCOUNTER — Other Ambulatory Visit: Payer: Self-pay | Admitting: *Deleted

## 2012-10-07 ENCOUNTER — Ambulatory Visit
Admission: RE | Admit: 2012-10-07 | Discharge: 2012-10-07 | Disposition: A | Discharge status: Home / Self Care | Payer: No Typology Code available for payment source | Source: Ambulatory Visit | Attending: General Surgery | Admitting: General Surgery

## 2012-10-07 DIAGNOSIS — K432 Incisional hernia without obstruction or gangrene: Secondary | ICD-10-CM

## 2012-10-07 DIAGNOSIS — N644 Mastodynia: Secondary | ICD-10-CM

## 2012-10-07 MED ORDER — IOHEXOL 300 MG/ML  SOLN
125.0000 mL | Freq: Once | INTRAMUSCULAR | Status: AC | PRN
  Administered 2012-10-07: 125 mL via INTRAVENOUS

## 2012-10-07 MED ORDER — ALPRAZOLAM 1 MG PO TABS: 1.0000 mg | ORAL_TABLET | Freq: Two times a day (BID) | ORAL | Status: DC | PRN

## 2012-10-07 MED ORDER — OXYCODONE HCL 15 MG PO TABS: 15.0000 mg | ORAL_TABLET | Freq: Three times a day (TID) | ORAL | Status: DC | PRN

## 2012-10-07 NOTE — Telephone Encounter (Signed)
Pt called yesterday asking to pick up her Xanax and Oxy-IR prescriptions today. After reviewing her chart, she is due for her 2 week refill. Will route to Eunice Blase, [place] for approval & signature (in Dr Gustavo Lah absence).

## 2012-10-11 ENCOUNTER — Other Ambulatory Visit (HOSPITAL_BASED_OUTPATIENT_CLINIC_OR_DEPARTMENT_OTHER): Payer: Self-pay | Admitting: Lab

## 2012-10-11 ENCOUNTER — Ambulatory Visit (HOSPITAL_BASED_OUTPATIENT_CLINIC_OR_DEPARTMENT_OTHER): Payer: Self-pay | Admitting: Medical

## 2012-10-11 ENCOUNTER — Ambulatory Visit (HOSPITAL_BASED_OUTPATIENT_CLINIC_OR_DEPARTMENT_OTHER)
Admission: RE | Admit: 2012-10-11 | Discharge: 2012-10-11 | Disposition: A | Discharge status: Home / Self Care | Payer: Self-pay | Source: Ambulatory Visit | Attending: Medical | Admitting: Medical

## 2012-10-11 VITALS — BP 136/78 | HR 68 | Temp 98.6°F | Resp 16 | Ht 64.0 in | Wt 245.0 lb

## 2012-10-11 DIAGNOSIS — Z853 Personal history of malignant neoplasm of breast: Secondary | ICD-10-CM

## 2012-10-11 DIAGNOSIS — R05 Cough: Secondary | ICD-10-CM

## 2012-10-11 DIAGNOSIS — N644 Mastodynia: Secondary | ICD-10-CM

## 2012-10-11 DIAGNOSIS — Z9010 Acquired absence of unspecified breast and nipple: Secondary | ICD-10-CM

## 2012-10-11 DIAGNOSIS — G8928 Other chronic postprocedural pain: Secondary | ICD-10-CM

## 2012-10-11 DIAGNOSIS — C50919 Malignant neoplasm of unspecified site of unspecified female breast: Secondary | ICD-10-CM

## 2012-10-11 DIAGNOSIS — R059 Cough, unspecified: Secondary | ICD-10-CM

## 2012-10-11 LAB — CBC WITH DIFFERENTIAL (CANCER CENTER ONLY)
BASO#: 0 10*3/uL (ref 0.0–0.2)
HCT: 37.9 % (ref 34.8–46.6)
HGB: 12.8 g/dL (ref 11.6–15.9)
LYMPH#: 1.3 10*3/uL (ref 0.9–3.3)
LYMPH%: 16.9 % (ref 14.0–48.0)
MCHC: 33.8 g/dL (ref 32.0–36.0)
MCV: 89 fL (ref 81–101)
MONO#: 0.5 10*3/uL (ref 0.1–0.9)
NEUT%: 71.2 % (ref 39.6–80.0)
WBC: 7.6 10*3/uL (ref 3.9–10.0)

## 2012-10-11 MED ORDER — OXYCODONE HCL 15 MG PO TABS: 15.0000 mg | ORAL_TABLET | Freq: Three times a day (TID) | ORAL | Status: DC | PRN

## 2012-10-11 MED ORDER — BENZONATATE 200 MG PO CAPS: 200.0000 mg | ORAL_CAPSULE | Freq: Three times a day (TID) | ORAL | Status: DC | PRN

## 2012-10-11 MED ORDER — ALPRAZOLAM 1 MG PO TABS: 1.0000 mg | ORAL_TABLET | Freq: Two times a day (BID) | ORAL | Status: DC | PRN

## 2012-10-11 NOTE — Progress Notes (Signed)
DIAGNOSES: 1. 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 2. 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 3. The patient is status post bilateral mastectomies for pain.  CURRENT THERAPY:  Observation.  INTERIM HISTORY: [name] presents today for an office followup visit.  She recently saw Dr. Dwain Sarna.  She still continues to have a small open wound on the right side of her mastectomy.  She, reports, some minimal drainage.  She is able to move her arms.  She did receive a prescription for post mastectomy, supplies.  She follows back up with Dr. Dwain Sarna.  Next week.  She still continues to receive Xanax, and oxycodone.  Every 2 weeks.  For months.  She just received this last Friday.  She does report a cough that has been 1 on for about one week.  She's not reported any fevers, with it.  She is requesting cough medication.  She does report some dyspnea on exertion.  She has not followed up with her primary care physician.  I will go ahead and give her Tessalon Perles for the cough.  I will order a chest x-ray.  For today.  I did advise her that if the chest x-ray is normal, however, she continues with the symptoms.  She either needs to followup with her primary care go to the emergency room.  She's not reporting a productive cough.  She's not reporting any shortness of breath at rest.  She's not reporting any chest pain.  She, reports, a decent, appetite.  She denies any nausea, vomiting, diarrhea, constipation, chest pain, shortness breath, or fevers, chills, night sweats.  She denies any lower leg swelling.  She denies any type of abdominal pain.  She denies any headaches, visual changes, or rashes.  Of note, she does have a hernia.  On the right lateral abdominal wall.  Dr. Dwain Sarna is following this.   Review of Systems: Constitutional:Negative for malaise/fatigue, fever, chills, weight loss, diaphoresis, activity change, appetite change, and  unexpected weight change.  HEENT: Negative for double vision, blurred vision, visual loss, ear pain, tinnitus, congestion, rhinorrhea, epistaxis sore throat or sinus disease, oral pain/lesion, tongue soreness Respiratory: Negative for cough, chest tightness, shortness of breath, wheezing and stridor.  Cardiovascular: Negative for chest pain, palpitations, leg swelling, orthopnea, PND, DOE or claudication Gastrointestinal: Negative for nausea, vomiting, abdominal pain, diarrhea, constipation, blood in stool, melena, hematochezia, abdominal distention, anal bleeding, rectal pain, anorexia and hematemesis.  Genitourinary: Negative for dysuria, frequency, hematuria,  Musculoskeletal: Negative for myalgias, back pain, joint swelling, arthralgias and gait problem.  Skin: Negative for rash, color change, pallor and wound.  Neurological:. Negative for dizziness/light-headedness, tremors, seizures, syncope, facial asymmetry, speech difficulty, weakness, numbness, headaches and paresthesias.  Hematological: Negative for adenopathy. Does not bruise/bleed easily.  Psychiatric/Behavioral:  Negative for depression, no loss of interest in normal activity or change in sleep pattern.   Physical Exam: This is fairly well-developed, well-nourished, 47 year old, white female, in no obvious distress Vitals: Temperature 98.6 degrees, pulse 68, respirations 16, blood pressure 136/78, weight 245 pounds HEENT reveals a normocephalic, atraumatic skull, no scleral icterus, no oral lesions  Neck is supple without any cervical or supraclavicular adenopathy.  Lungs are clear to auscultation bilaterally. There are no wheezes, rales or rhonci Cardiac is regular rate and rhythm with a normal S1 and S2. There are no murmurs, rubs, or bruits.  Abdomen is soft with good bowel sounds, there is no  palpable mass. There is no palpable hepatosplenomegaly. There is no palpable fluid wave.  Musculoskeletal no tenderness of the spine, ribs,  or hips.  Extremities there are no clubbing, cyanosis, or edema.  Skin no petechia, purpura or ecchymosis Neurologic is nonfocal. Bilateral chest wall: The right side does have a small open wound with very minimal drainage.  She also has a lot of excess adiposity of edema after healing.  The left side is with a minimal open wound and continues to heal.  Laboratory Data: White count 7.6, hemoglobin 12.8, hematocrit 37.9, platelets 246,000  Current Outpatient Prescriptions on File Prior to Visit  Medication Sig Dispense Refill  . ALPRAZolam (XANAX) 1 MG tablet Take 1 tablet (1 mg total) by mouth 2 (two) times daily as needed. For anxiety  30 tablet  0  . amLODipine (NORVASC) 10 MG tablet Take 10 mg by mouth daily before breakfast.      . lansoprazole (PREVACID) 30 MG capsule Take 30 mg by mouth 2 (two) times daily.       Marland Kitchen lisinopril (PRINIVIL,ZESTRIL) 10 MG tablet Take 10 mg by mouth daily before breakfast.       . oxyCODONE (ROXICODONE) 15 MG immediate release tablet Take 1 tablet (15 mg total) by mouth every 8 (eight) hours as needed for pain. 2 WEEK SUPPLY  45 tablet  0   No current facility-administered medications on file prior to visit.   Assessment/Plan: This is a 47 year old, white female, with the following issues:  #1.  Past history of stage I ductal carcinoma of the left breast.  Unfortunately, she could not afford adjuvant aromatase inhibitor therapy.  She has had bilateral mastectomies, now for pain control.  She still continues on her pain medication.   #2.  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  She is status post nephrectomy.  There is no evidence of any recurrence.  #3.  Pain management.  She is requesting a refill on her Xanax and oxycodone.  We do this every 2 weeks.  She did receive these 2 prescriptions on 10/07/2012.  #4.  Cough.  She is having some dyspnea on exertion.  I am sending her for chest x-ray.  We will await the results.  If the chest x-ray is  normal.  She will need to followup with her primary care physician for her symptoms.  I did a posterior.  She continues to have any type of chest discomfort, or shortness of breath.  She needs to go to the emergency room.  I did prescribe her Tessalon Perles.  #5.  Followup.  She will follow back up with [place] in about one month, but before then should there be questions or concerns.

## 2012-10-21 ENCOUNTER — Ambulatory Visit (INDEPENDENT_AMBULATORY_CARE_PROVIDER_SITE_OTHER): Payer: Self-pay | Admitting: General Surgery

## 2012-10-21 ENCOUNTER — Other Ambulatory Visit: Payer: Self-pay | Admitting: *Deleted

## 2012-10-21 ENCOUNTER — Encounter (INDEPENDENT_AMBULATORY_CARE_PROVIDER_SITE_OTHER): Payer: Self-pay | Admitting: General Surgery

## 2012-10-21 VITALS — BP 136/68 | HR 76 | Temp 97.4°F | Resp 28 | Ht 64.5 in | Wt 236.0 lb

## 2012-10-21 DIAGNOSIS — Z09 Encounter for follow-up examination after completed treatment for conditions other than malignant neoplasm: Secondary | ICD-10-CM

## 2012-10-21 DIAGNOSIS — N644 Mastodynia: Secondary | ICD-10-CM

## 2012-10-21 MED ORDER — ALPRAZOLAM 1 MG PO TABS: 1.0000 mg | ORAL_TABLET | Freq: Two times a day (BID) | ORAL | Status: DC | PRN

## 2012-10-21 MED ORDER — OXYCODONE HCL 15 MG PO TABS: 15.0000 mg | ORAL_TABLET | Freq: Three times a day (TID) | ORAL | Status: DC | PRN

## 2012-10-21 NOTE — Telephone Encounter (Signed)
Pt came to the office this morning without calling ahead of time asking for her Xanax and Oxy-IR prescriptions. She said she had phone trouble yesterday. After reviewing her chart, she is due for her 2 week refill. Will route to Dr Myna Hidalgo for approval & signature.

## 2012-10-24 NOTE — Progress Notes (Signed)
Subjective:     Patient ID: [name], female   DOB: 02/04/1966, 47 y.o.   MRN: [id number]  HPI 19 yof s/p bilateral mastectomies that are slowly healing and she is actually doing pretty well from.  Last visit she complained of increased pain at the site of an incisional hernia in her right flank.  She comes back in today to discuss her ct scan results and is otherwise unchanged.  She is still smoking  Review of Systems CT ABDOMEN AND PELVIS WITH CONTRAST  Technique: Multidetector CT imaging of the abdomen and pelvis was  performed following the standard protocol during bolus  administration of intravenous contrast.  Contrast: OMNIPAQUE IOHEXOL 300 MG/ML SOLN  Comparison: CT scan dated 09/01/2010  Findings: The patient has an incisional hernia in the right flank  just below the costal margin. The hernia extends through a defect  in the right transverse abdominus and internal oblique muscles.  The external oblique muscle is stretched over the hernia but is  intact. The hernia contains a portion of the ascending colon.  There is no obstruction of the bowel.  The liver, spleen, pancreas, right adrenal gland, and left kidney  are normal. There is slight deformity in the upper pole of the  right kidney secondary to previous right kidney surgery. 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  Uterus and left ovary been removed. Right ovary is slightly  prominent but decreased in size since the prior exam.  No significant osseous abnormality.  IMPRESSION:  Incisional hernia in the right flank containing a portion of the  ascending colon.  No other significant abnormality.     Objective:   Physical Exam Unchanged hernia, mastectomy wounds nearly healed    Assessment:     S/p bilateral mastectomies, history of breast cancer Incisional hernia right flank     Plan:     We discussed hernia repair and what her recurrence rate is given obesity and  smoking.  I told her at minimum she needs to stop smoking for at least a month before I would consider discussing repair of this hernia. She is going to call me back.  She understands risks of incarceration.

## 2012-11-04 ENCOUNTER — Other Ambulatory Visit: Payer: Self-pay | Admitting: *Deleted

## 2012-11-04 DIAGNOSIS — N644 Mastodynia: Secondary | ICD-10-CM

## 2012-11-04 MED ORDER — ALPRAZOLAM 1 MG PO TABS: 1.0000 mg | ORAL_TABLET | Freq: Two times a day (BID) | ORAL | Status: DC | PRN

## 2012-11-04 MED ORDER — OXYCODONE HCL 15 MG PO TABS: 15.0000 mg | ORAL_TABLET | Freq: Three times a day (TID) | ORAL | Status: DC | PRN

## 2012-11-04 NOTE — Telephone Encounter (Signed)
Pt called yesterday asking to pick up her Xanax and Oxy-IR prescriptions today. After reviewing her chart, she is due for her 2 week refill. Will route to Dr Ennever for approval & signature. 

## 2012-11-15 ENCOUNTER — Ambulatory Visit (HOSPITAL_BASED_OUTPATIENT_CLINIC_OR_DEPARTMENT_OTHER): Payer: Self-pay | Admitting: Hematology & Oncology

## 2012-11-15 ENCOUNTER — Other Ambulatory Visit (HOSPITAL_BASED_OUTPATIENT_CLINIC_OR_DEPARTMENT_OTHER): Payer: Self-pay | Admitting: Lab

## 2012-11-15 VITALS — BP 146/75 | HR 93 | Temp 98.5°F | Resp 16 | Ht 64.0 in | Wt 236.0 lb

## 2012-11-15 DIAGNOSIS — K439 Ventral hernia without obstruction or gangrene: Secondary | ICD-10-CM

## 2012-11-15 DIAGNOSIS — Z853 Personal history of malignant neoplasm of breast: Secondary | ICD-10-CM

## 2012-11-15 DIAGNOSIS — Z9010 Acquired absence of unspecified breast and nipple: Secondary | ICD-10-CM

## 2012-11-15 DIAGNOSIS — N644 Mastodynia: Secondary | ICD-10-CM

## 2012-11-15 DIAGNOSIS — G894 Chronic pain syndrome: Secondary | ICD-10-CM

## 2012-11-15 LAB — COMPREHENSIVE METABOLIC PANEL
AST: 10 U/L (ref 0–37)
Alkaline Phosphatase: 87 U/L (ref 39–117)
BUN: 7 mg/dL (ref 6–23)
Calcium: 9.1 mg/dL (ref 8.4–10.5)
Chloride: 106 mEq/L (ref 96–112)
Creatinine, Ser: 0.63 mg/dL (ref 0.50–1.10)
Total Bilirubin: 0.5 mg/dL (ref 0.3–1.2)

## 2012-11-15 LAB — CBC WITH DIFFERENTIAL (CANCER CENTER ONLY)
BASO#: 0 10*3/uL (ref 0.0–0.2)
EOS%: 3.2 % (ref 0.0–7.0)
HCT: 41.7 % (ref 34.8–46.6)
HGB: 14.1 g/dL (ref 11.6–15.9)
LYMPH%: 20.2 % (ref 14.0–48.0)
MCH: 29.9 pg (ref 26.0–34.0)
MCHC: 33.8 g/dL (ref 32.0–36.0)
MONO%: 6.1 % (ref 0.0–13.0)
NEUT%: 70.2 % (ref 39.6–80.0)

## 2012-11-15 MED ORDER — OXYCODONE HCL 15 MG PO TABS: 15.0000 mg | ORAL_TABLET | Freq: Three times a day (TID) | ORAL | Status: DC | PRN

## 2012-11-15 MED ORDER — ALPRAZOLAM 1 MG PO TABS: 1.0000 mg | ORAL_TABLET | Freq: Two times a day (BID) | ORAL | Status: DC | PRN

## 2012-11-15 NOTE — Progress Notes (Signed)
This office note has been dictated.

## 2012-11-16 NOTE — Progress Notes (Signed)
DIAGNOSES: 1. Stage I ductal carcinoma of the left breast-status post bilateral     mastectomy secondary to chronic pain. 2. Renal cell carcinoma of the right kidney-stage I-status post     nephrectomy. 3. The patient now has a right lateral abdominal wall hernia that     needs to be repaired. 4. Chronic pain syndrome secondary to breast disease/radiation.  CURRENT THERAPY:  Observation.  INTERIM HISTORY:  [name] comes in for followup.  She now is going to have to undergo a hernia repair.  She has a significant hernia on the right lateral chest wall.  This occurred after she had a nephrectomy. She had a nephrectomy a few years ago.  She sees Dr. Dwain Sarna of [facility] I think later on this week to try to set up a date.  She has finally gotten better from her mastectomies.  She was having issues with infections.  Apparently she has stopped smoking.  She has not smoked for about a month.  She is having no problems with her bowels or bladder.  Her intermittent skin rash has come back.  She said this appeared a few days ago.  There is some occasional pruritus with this.  She has not noticed any leg swelling.  She has had no bleeding.  She has had no fever.  PHYSICAL EXAMINATION:  General:  This is an obese white female in no obvious distress.  Vital signs:  Temperature of 98.5, pulse 93, respiratory rate 16, blood pressure 146/75.  Weight is 236.  Head and neck:  Normocephalic, atraumatic skull.  There are no ocular or oral lesions.  There are no palpable cervical or supraclavicular lymph nodes. Lungs:  Clear bilaterally.  Cardiac:  Regular rate and rhythm with a normal S1, S2.  There are no murmurs, rubs, or bruits.  Chest wall: Shows bilateral mastectomies.  These are healing.  No erythema or exudate is noted with these.  There is still some tenderness on the anterior chest wall bilaterally.  There is no bilateral axillary adenopathy.  Abdomen:  Shows right  nephrectomy scar.  There is an abdominal wall hernia associated with this.  There is no fluid wave. There is no abdominal mass.  There is no palpable hepatosplenomegaly. Extremities:  Show no clubbing, cyanosis or edema.  There is no lymphedema of the left arm.  Neurological:  Shows no focal neurological deficits.  LABORATORY STUDIES:  White cell count is 9.8, hemoglobin 14.1, hematocrit 41.7, platelet count 232.  IMPRESSION:  [name] is a 47 year old white female with a history of stage I ductal carcinoma of the left breast.  This probably has been 5 years or so since she had the lumpectomy and radiation.  Because of chronic breast pain, she did have bilateral mastectomies. Again, this has helped her out a little bit.  I just cannot believe that she needs surgery now for this abdominal wall hernia.  I understand the need for to prevent any possible strangulation or "trapping" of the hernia.  We will plan to get her back to see [place] in another 2-3 months.  I probably will have to see her in the hospital once she is there.  She is not sure when she will have surgery.    ______________________________ Josph Macho, M.D. PRE/MEDQ  D:  11/15/2012  T:  11/16/2012  Job:  [id number]

## 2012-11-18 ENCOUNTER — Encounter (INDEPENDENT_AMBULATORY_CARE_PROVIDER_SITE_OTHER): Payer: Self-pay | Admitting: General Surgery

## 2012-11-18 ENCOUNTER — Ambulatory Visit (INDEPENDENT_AMBULATORY_CARE_PROVIDER_SITE_OTHER): Payer: Self-pay | Admitting: General Surgery

## 2012-11-18 VITALS — BP 136/78 | HR 80 | Temp 99.0°F | Resp 18 | Ht 64.0 in | Wt 238.0 lb

## 2012-11-18 DIAGNOSIS — K432 Incisional hernia without obstruction or gangrene: Secondary | ICD-10-CM

## 2012-11-20 NOTE — Progress Notes (Signed)
Subjective:     Patient ID: [name], female   DOB: 11-Dec-1965, 47 y.o.   MRN: [id number]  HPI 20 yof who returns after undergoing bilateral mastectomies for which she has taken a long time to recover.  This in large part is due to her smoking.  This is all still slowly improving. She has slowly been developing a more symptomatic right flank hernia.  A ct shows this incisional hernia with colon in hernia sac.  I discussed with her possibilities of repairing this.  I also told her a month ago that she would need to begin losing some weight as well as not be smoking due to recurrence rate as well as complication rate in her.  She comes back in today and she states she has not smoked in one month.  She still smells like smoke and the entire exam room does as well.  I do think she is walking and not smoking but the rest of her family continues to smoke around her.  Review of Systems     Objective:   Physical Exam Unchanged mildly tender reducible incisional hernia right flank    Assessment:     Incisional hernia    Plan:    we discussed for about 20 minutes today repair of this incisional hernia through flank incision.  I told her I would not even discuss doing this, unless became more urgent, until she began to lose some weight and she was not smoking for at least 6 weeks.  I also asked her to attempt to control the secondhand smoke at her house.  I told her I would see her back in another month or so to check her progress.

## 2012-11-21 ENCOUNTER — Telehealth (INDEPENDENT_AMBULATORY_CARE_PROVIDER_SITE_OTHER): Payer: Self-pay | Admitting: *Deleted

## 2012-11-21 NOTE — Telephone Encounter (Signed)
Patient called to state she wants to have the surgery to repair her hernia sooner than June.  Patient states she is unable to get comfortable and having trouble breathing due to hernia.

## 2012-11-23 ENCOUNTER — Telehealth (INDEPENDENT_AMBULATORY_CARE_PROVIDER_SITE_OTHER): Payer: Self-pay | Admitting: *Deleted

## 2012-11-23 NOTE — Telephone Encounter (Signed)
Patient has called again regarding moving forward with her surgery.  Explained to patient again that Dr. Doreen Salvage office note shows patient will need to lose weight and stop smoking before surgery can be performed.  Patient states she understands that however she is having trouble breathing and pain so she needs to go ahead with the surgery.  Please advise.

## 2012-11-24 ENCOUNTER — Telehealth (INDEPENDENT_AMBULATORY_CARE_PROVIDER_SITE_OTHER): Payer: Self-pay

## 2012-11-24 NOTE — Telephone Encounter (Signed)
Called pt but couldn't leave a message. I want to let her know that Dr Dwain Sarna said he was not going to consider the surgery for another month b/c he wants her to be done smoking for 8wks before he will even schedule the pt. Dr Dwain Sarna said if the pt wants a 2nd opinion we can arrange the appt. If the pt ever thinks this hernia is obstructed she will need to go to the ER per Dr Dwain Sarna. Dr Dwain Sarna is very strong about the pt continue to stop smoking and lose weight.

## 2012-12-02 ENCOUNTER — Other Ambulatory Visit: Payer: Self-pay | Admitting: *Deleted

## 2012-12-02 DIAGNOSIS — N644 Mastodynia: Secondary | ICD-10-CM

## 2012-12-02 MED ORDER — ALPRAZOLAM 1 MG PO TABS: 1.0000 mg | ORAL_TABLET | Freq: Two times a day (BID) | ORAL | Status: DC | PRN

## 2012-12-02 MED ORDER — OXYCODONE HCL 15 MG PO TABS: 15.0000 mg | ORAL_TABLET | Freq: Three times a day (TID) | ORAL | Status: DC | PRN

## 2012-12-02 NOTE — Telephone Encounter (Signed)
Pt called yesterday asking to pick up her Xanax and Oxy-IR prescriptions today. After reviewing her chart, she is due for her 2 week refill. Will route to Dr Ennever for approval & signature. 

## 2012-12-05 ENCOUNTER — Telehealth (INDEPENDENT_AMBULATORY_CARE_PROVIDER_SITE_OTHER): Payer: Self-pay | Admitting: *Deleted

## 2012-12-05 NOTE — Telephone Encounter (Signed)
Patient called to state that she is having nausea and vomiting at this time and asking to speak with Dr. Dwain Sarna.  This RN explained to patient that Dr. Dwain Sarna has noted that he will not do and elective surgery on this patient until June at the earliest.  Patient would need to be evaluated in the ER if she feels like the symptoms are an emergency.  Explained to patient that if they evaluate her in the ER and it is found she needs emergency surgery there are surgeons at both [facility] and [facility].  Patient states understanding and states she will go to the ER at this time (unsure which one).

## 2012-12-16 ENCOUNTER — Other Ambulatory Visit: Payer: Self-pay | Admitting: *Deleted

## 2012-12-16 DIAGNOSIS — N644 Mastodynia: Secondary | ICD-10-CM

## 2012-12-16 MED ORDER — OXYCODONE HCL 15 MG PO TABS: 15.0000 mg | ORAL_TABLET | Freq: Three times a day (TID) | ORAL | Status: DC | PRN

## 2012-12-16 MED ORDER — ALPRAZOLAM 1 MG PO TABS: 1.0000 mg | ORAL_TABLET | Freq: Two times a day (BID) | ORAL | Status: DC | PRN

## 2012-12-16 NOTE — Telephone Encounter (Signed)
Pt called yesterday asking to pick up her Xanax and Oxy-IR prescriptions today. After reviewing her chart, she is due for her 2 week refill. Will route to Dr Myna Hidalgo for approval & signature.

## 2012-12-26 ENCOUNTER — Telehealth (INDEPENDENT_AMBULATORY_CARE_PROVIDER_SITE_OTHER): Payer: Self-pay | Admitting: *Deleted

## 2012-12-26 ENCOUNTER — Telehealth: Payer: Self-pay | Admitting: *Deleted

## 2012-12-26 ENCOUNTER — Ambulatory Visit (INDEPENDENT_AMBULATORY_CARE_PROVIDER_SITE_OTHER): Payer: Self-pay | Admitting: General Surgery

## 2012-12-26 ENCOUNTER — Encounter: Payer: Self-pay | Admitting: *Deleted

## 2012-12-26 ENCOUNTER — Encounter (INDEPENDENT_AMBULATORY_CARE_PROVIDER_SITE_OTHER): Payer: Self-pay | Admitting: General Surgery

## 2012-12-26 VITALS — BP 140/90 | HR 62 | Temp 97.7°F | Resp 14 | Ht 63.0 in | Wt 239.4 lb

## 2012-12-26 DIAGNOSIS — K432 Incisional hernia without obstruction or gangrene: Secondary | ICD-10-CM

## 2012-12-26 NOTE — Telephone Encounter (Signed)
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 Fax: Salome ArntFredricka Bonine [phone].

## 2012-12-26 NOTE — Telephone Encounter (Signed)
Sw pt gv appt for the nutritionist for 01/03/13@ 2:30pm. Pt is aware...td

## 2012-12-26 NOTE — Progress Notes (Signed)
Subjective:     Patient ID: [name], female   DOB: 09/30/1965, 47 y.o.   MRN: [id number]  HPI 29 yof who returns after undergoing bilateral mastectomies for which she has taken a long time to recover. This in large part is due to her smoking.  She has slowly been developing a more symptomatic right flank hernia. A ct shows this incisional hernia with colon in hernia sac. I discussed with her possibilities of repairing this. I also told her 2 months ago that she would need to begin losing some weight as well as not be smoking due to recurrence rate as well as complication rate in her. She comes back in today and she states she has not smoked since then and has reduced her exposure to secondhand smoke. She does not smell like smoke today.  She has gained one pound since then.  She has been trying to walk and eat better but I think she needs some ntn counselling.  Review of Systems     Objective:   Physical Exam Right flank incision healed with reducible hernia    Assessment:     Incisional hernia     Plan:     Unhappy that she has stopped smoking. I think she is well on her way to doing this. I do think however that she needs to lose some weight before we do this due to her risk and the nature of this hernia. She and I discussed this again today. I think that she needs a nutritional counseling for this. I think this would be good for both hernias well as her breast cancer. I will see if we can get her over to the cancer Center to be seen by the nutritionist to attempt to lose some weight.

## 2012-12-30 ENCOUNTER — Telehealth (INDEPENDENT_AMBULATORY_CARE_PROVIDER_SITE_OTHER): Payer: Self-pay | Admitting: *Deleted

## 2012-12-30 ENCOUNTER — Telehealth (INDEPENDENT_AMBULATORY_CARE_PROVIDER_SITE_OTHER): Payer: Self-pay

## 2012-12-30 ENCOUNTER — Other Ambulatory Visit: Payer: Self-pay | Admitting: *Deleted

## 2012-12-30 DIAGNOSIS — F411 Generalized anxiety disorder: Secondary | ICD-10-CM

## 2012-12-30 DIAGNOSIS — N644 Mastodynia: Secondary | ICD-10-CM

## 2012-12-30 MED ORDER — OXYCODONE HCL 15 MG PO TABS: 15.0000 mg | ORAL_TABLET | Freq: Three times a day (TID) | ORAL | Status: DC | PRN

## 2012-12-30 MED ORDER — ALPRAZOLAM 1 MG PO TABS: 1.0000 mg | ORAL_TABLET | Freq: Two times a day (BID) | ORAL | Status: DC | PRN

## 2012-12-30 NOTE — Telephone Encounter (Signed)
Patient called asking to speak with Elease Hashimoto, Dr. Doreen Salvage nurse today. Alisha aware and will call to speak with patient.

## 2012-12-30 NOTE — Telephone Encounter (Signed)
Returned pt's call. The pt was requesting a letter from Dr Dwain Sarna to give to her Disability lawyer today stating that she can not work now due to her surgery last year and the hernia now. I advised pt that Dr Dwain Sarna would not write a letter on this behalf b/c we would allow her to work now. The pt is able to do anything she wishes at this point from Dr Doreen Salvage standpoint there are no restrictions from [place]. The pt stated that she is not asking for a letter stating that she will never be allowed to work just a letter for now till she gets the hernia fixed by Dr Dwain Sarna. The pt stated that Dr Drue Dun told her she couldn't work so I told her then she would need to call Dr Joycelyn Das office to request the letter b/c Dr Dwain Sarna will not authorize a letter about not working. The pt understands.

## 2012-12-30 NOTE — Telephone Encounter (Signed)
Pt called yesterday asking to pick up her Xanax and Oxy-IR prescriptions today. After reviewing her chart, she is due for her 2 week refill. Will route to Dr Ennever for approval & signature. 

## 2013-01-03 ENCOUNTER — Encounter: Payer: Self-pay | Admitting: Nutrition

## 2013-01-03 NOTE — Progress Notes (Signed)
Patient did not call or show for scheduled nutrition appointment on January 03, 2013.

## 2013-01-13 ENCOUNTER — Other Ambulatory Visit: Payer: Self-pay | Admitting: *Deleted

## 2013-01-13 DIAGNOSIS — F411 Generalized anxiety disorder: Secondary | ICD-10-CM

## 2013-01-13 DIAGNOSIS — N644 Mastodynia: Secondary | ICD-10-CM

## 2013-01-13 MED ORDER — ALPRAZOLAM 1 MG PO TABS: 1.0000 mg | ORAL_TABLET | Freq: Two times a day (BID) | ORAL | Status: DC | PRN

## 2013-01-13 MED ORDER — OXYCODONE HCL 15 MG PO TABS: 15.0000 mg | ORAL_TABLET | Freq: Three times a day (TID) | ORAL | Status: DC | PRN

## 2013-01-27 ENCOUNTER — Ambulatory Visit (HOSPITAL_BASED_OUTPATIENT_CLINIC_OR_DEPARTMENT_OTHER): Payer: Self-pay | Admitting: Hematology & Oncology

## 2013-01-27 ENCOUNTER — Telehealth: Payer: Self-pay | Admitting: Hematology & Oncology

## 2013-01-27 VITALS — BP 159/87 | HR 61 | Temp 98.5°F | Resp 16 | Ht 63.0 in | Wt 238.0 lb

## 2013-01-27 DIAGNOSIS — N951 Menopausal and female climacteric states: Secondary | ICD-10-CM

## 2013-01-27 DIAGNOSIS — N644 Mastodynia: Secondary | ICD-10-CM

## 2013-01-27 DIAGNOSIS — F411 Generalized anxiety disorder: Secondary | ICD-10-CM

## 2013-01-27 MED ORDER — ALPRAZOLAM 1 MG PO TABS: 1.0000 mg | ORAL_TABLET | Freq: Two times a day (BID) | ORAL | Status: DC | PRN

## 2013-01-27 MED ORDER — OXYCODONE HCL 15 MG PO TABS: 15.0000 mg | ORAL_TABLET | Freq: Three times a day (TID) | ORAL | Status: DC | PRN

## 2013-01-27 MED ORDER — MEGESTROL ACETATE 20 MG PO TABS: 20.0000 mg | ORAL_TABLET | Freq: Every day | ORAL | Status: DC

## 2013-01-27 NOTE — Telephone Encounter (Signed)
Pt here today to get MR for a MCD hearing.  Auth/Disclosure has been signed.    COPY SCANNED

## 2013-01-27 NOTE — Progress Notes (Signed)
This office note has been dictated.

## 2013-01-28 NOTE — Progress Notes (Signed)
DIAGNOSES: 1. 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 2. 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 3. Status post bilateral mastectomies for chronic breast pain. 4. Worsening right lateral abdominal wall hernia.  CURRENT THERAPY:  Observation.  INTERIM HISTORY:  [name] comes in for a followup.  Unfortunately, she still has a lot of issues going on.  She apparently went to the emergency room down in [place].  I think she was having some abdominal pain.  She underwent a CT scan.  She apparently saw that she had a "tumor" on her left kidney. She had the x-ray sent to [facility].  They called her, and from what she tells me, she was told that this was not cancer.  I think she has an appointment with them next week. She is still has the painful right flank hernia.  She sees Dr. Dwain Sarna for this.  From what she tells me, he is not too excited about operating on this right now. She is not having as many issues with respect to her mastectomies. There is no further infections of the mastectomy sites. She is not having any change in bowel or bladder habits.  She has had no bleeding.  There has been no fevers, sweats or chills.  Apparently, she still has a lot of issues with her family.  Apparently, family members have moved in, and she is not too happy about this.  She is totally stressed out.  PHYSICAL EXAMINATION:  General:  This is an obese white female in no obvious distress.  Vital signs:  Show a temperature of 98.5, pulse 60. Respiratory rate 16, blood pressure 159/87.  Weight is 238.  Head and neck:  Exam shows a normocephalic, atraumatic skull.  There are no ocular or oral lesions.  Neck:  There are no palpable cervical or supraclavicular lymph nodes.  Lungs:  Clear bilaterally.  Cardiac: Regular rate and rhythm with a normal S1, S2.  There are no murmurs, rubs or bruits.  Abdomen:  Soft with good bowel sounds.  There is no palpable  abdominal mass.  There is no fluid wave.  There is no palpable hepatosplenomegaly.  She does have the painful right flank hernia.  This is quite large.  Chest Wall:  Shows bilateral mastectomies.  These are well healed.  I see no erythema.  I feel no warmth.  Appears there is no obvious infection.  Back:  No tenderness over the spine, ribs or hips. Extremities:  Show no clubbing, cyanosis or edema.  Neurological:  Shows no focal neurological deficits.  LABORATORY STUDIES:  White cell count of 5.8, hemoglobin 14.1, hematocrit 41.7, platelet count 232,000. Calcium is 9.1 with an albumin of 4.0.  LFTs are normal.  IMPRESSION:  [name] is a very nice 47 year old white female with multiple malignancies.  I think she is cured of these.  She has ongoing pain issues.  We are trying to help her with these.  Because of lack of insurance, we have been limited as to how we can try to deal with her pain needs. From my point of view, we can just get her back in 3 more months. __________there is nothing that we needed to do that was "active" for her. I went ahead and refilled the oxycodone and her Xanax.    ______________________________ Josph Macho, M.D. PRE/MEDQ  D:  01/27/2013  T:  01/28/2013  Job:  [id number]

## 2013-02-09 ENCOUNTER — Other Ambulatory Visit: Payer: Self-pay | Admitting: *Deleted

## 2013-02-09 DIAGNOSIS — F411 Generalized anxiety disorder: Secondary | ICD-10-CM

## 2013-02-09 DIAGNOSIS — N644 Mastodynia: Secondary | ICD-10-CM

## 2013-02-09 MED ORDER — ALPRAZOLAM 1 MG PO TABS: 1.0000 mg | ORAL_TABLET | Freq: Two times a day (BID) | ORAL | Status: DC | PRN

## 2013-02-09 MED ORDER — OXYCODONE HCL 15 MG PO TABS: 15.0000 mg | ORAL_TABLET | Freq: Three times a day (TID) | ORAL | Status: DC | PRN

## 2013-02-22 DIAGNOSIS — K5792 Diverticulitis of intestine, part unspecified, without perforation or abscess without bleeding: Secondary | ICD-10-CM | POA: Insufficient documentation

## 2013-02-22 DIAGNOSIS — D3500 Benign neoplasm of unspecified adrenal gland: Secondary | ICD-10-CM | POA: Insufficient documentation

## 2013-02-22 DIAGNOSIS — Z86718 Personal history of other venous thrombosis and embolism: Secondary | ICD-10-CM | POA: Insufficient documentation

## 2013-02-22 DIAGNOSIS — J45909 Unspecified asthma, uncomplicated: Secondary | ICD-10-CM | POA: Insufficient documentation

## 2013-02-24 ENCOUNTER — Other Ambulatory Visit: Payer: Self-pay | Admitting: *Deleted

## 2013-02-24 DIAGNOSIS — F411 Generalized anxiety disorder: Secondary | ICD-10-CM

## 2013-02-24 DIAGNOSIS — N644 Mastodynia: Secondary | ICD-10-CM

## 2013-02-24 MED ORDER — ALPRAZOLAM 1 MG PO TABS: 1.0000 mg | ORAL_TABLET | Freq: Two times a day (BID) | ORAL | Status: DC | PRN

## 2013-02-24 MED ORDER — OXYCODONE HCL 15 MG PO TABS: 15.0000 mg | ORAL_TABLET | Freq: Three times a day (TID) | ORAL | Status: DC | PRN

## 2013-02-24 NOTE — Telephone Encounter (Signed)
Pt called on Wednesday asking to pick up her Xanax and Oxy-IR prescriptions today. After reviewing her chart, she is due for her 2 week refill. Will route to Dr Ennever for approval & signature.  

## 2013-02-27 ENCOUNTER — Encounter (INDEPENDENT_AMBULATORY_CARE_PROVIDER_SITE_OTHER): Payer: Self-pay | Admitting: General Surgery

## 2013-03-01 ENCOUNTER — Encounter (INDEPENDENT_AMBULATORY_CARE_PROVIDER_SITE_OTHER): Payer: Self-pay | Admitting: General Surgery

## 2013-03-06 ENCOUNTER — Encounter (INDEPENDENT_AMBULATORY_CARE_PROVIDER_SITE_OTHER): Payer: Self-pay | Admitting: General Surgery

## 2013-03-10 ENCOUNTER — Other Ambulatory Visit: Payer: Self-pay | Admitting: *Deleted

## 2013-03-10 DIAGNOSIS — N644 Mastodynia: Secondary | ICD-10-CM

## 2013-03-10 DIAGNOSIS — F411 Generalized anxiety disorder: Secondary | ICD-10-CM

## 2013-03-10 MED ORDER — ALPRAZOLAM 1 MG PO TABS: 1.0000 mg | ORAL_TABLET | Freq: Two times a day (BID) | ORAL | Status: DC | PRN

## 2013-03-10 MED ORDER — OXYCODONE HCL 15 MG PO TABS: 15.0000 mg | ORAL_TABLET | Freq: Three times a day (TID) | ORAL | Status: DC | PRN

## 2013-03-10 NOTE — Telephone Encounter (Signed)
Pt called on Wednesday asking to pick up her Xanax and Oxy-IR prescriptions today. After reviewing her chart, she is due for her 2 week refill. Will route to Dr Myna Hidalgo for approval & signature.

## 2013-03-23 ENCOUNTER — Other Ambulatory Visit: Payer: Self-pay | Admitting: *Deleted

## 2013-03-23 ENCOUNTER — Telehealth: Payer: Self-pay | Admitting: Hematology & Oncology

## 2013-03-23 DIAGNOSIS — N644 Mastodynia: Secondary | ICD-10-CM

## 2013-03-23 DIAGNOSIS — F411 Generalized anxiety disorder: Secondary | ICD-10-CM

## 2013-03-23 MED ORDER — ALPRAZOLAM 1 MG PO TABS: 1.0000 mg | ORAL_TABLET | Freq: Two times a day (BID) | ORAL | Status: DC | PRN

## 2013-03-23 MED ORDER — OXYCODONE HCL 15 MG PO TABS: 15.0000 mg | ORAL_TABLET | Freq: Three times a day (TID) | ORAL | Status: DC | PRN

## 2013-03-23 NOTE — Telephone Encounter (Signed)
Pt moved 9-22 to 9-23 wanted to be transferred to nurse line. I transferred the call to Amy RN

## 2013-03-23 NOTE — Telephone Encounter (Signed)
Pt called today asking to pick up her Xanax and Oxy-IR prescriptions tomorrow between 10:00 - 10:30.. After reviewing her chart, she is due for her 2 week refill. Will route to Dr Myna Hidalgo for approval & signature.

## 2013-04-07 ENCOUNTER — Other Ambulatory Visit: Payer: Self-pay | Admitting: *Deleted

## 2013-04-07 DIAGNOSIS — N644 Mastodynia: Secondary | ICD-10-CM

## 2013-04-07 DIAGNOSIS — F411 Generalized anxiety disorder: Secondary | ICD-10-CM

## 2013-04-07 MED ORDER — ALPRAZOLAM 1 MG PO TABS: 1.0000 mg | ORAL_TABLET | Freq: Two times a day (BID) | ORAL | Status: DC | PRN

## 2013-04-07 MED ORDER — OXYCODONE HCL 15 MG PO TABS: 15.0000 mg | ORAL_TABLET | Freq: Three times a day (TID) | ORAL | Status: DC | PRN

## 2013-04-10 ENCOUNTER — Ambulatory Visit: Payer: Self-pay | Admitting: Hematology & Oncology

## 2013-04-10 ENCOUNTER — Other Ambulatory Visit: Payer: Self-pay | Admitting: Lab

## 2013-04-11 ENCOUNTER — Ambulatory Visit (HOSPITAL_BASED_OUTPATIENT_CLINIC_OR_DEPARTMENT_OTHER): Payer: Self-pay | Admitting: Hematology & Oncology

## 2013-04-11 ENCOUNTER — Other Ambulatory Visit (HOSPITAL_BASED_OUTPATIENT_CLINIC_OR_DEPARTMENT_OTHER): Payer: Self-pay | Admitting: Lab

## 2013-04-11 VITALS — BP 126/71 | HR 71 | Temp 98.4°F | Resp 16 | Ht 63.0 in | Wt 249.0 lb

## 2013-04-11 DIAGNOSIS — J9801 Acute bronchospasm: Secondary | ICD-10-CM

## 2013-04-11 DIAGNOSIS — F411 Generalized anxiety disorder: Secondary | ICD-10-CM

## 2013-04-11 DIAGNOSIS — C50419 Malignant neoplasm of upper-outer quadrant of unspecified female breast: Secondary | ICD-10-CM

## 2013-04-11 DIAGNOSIS — C649 Malignant neoplasm of unspecified kidney, except renal pelvis: Secondary | ICD-10-CM

## 2013-04-11 DIAGNOSIS — N644 Mastodynia: Secondary | ICD-10-CM

## 2013-04-11 DIAGNOSIS — N951 Menopausal and female climacteric states: Secondary | ICD-10-CM

## 2013-04-11 DIAGNOSIS — G893 Neoplasm related pain (acute) (chronic): Secondary | ICD-10-CM

## 2013-04-11 LAB — CBC WITH DIFFERENTIAL (CANCER CENTER ONLY)
BASO%: 0.3 % (ref 0.0–2.0)
EOS%: 4.5 % (ref 0.0–7.0)
HCT: 40.6 % (ref 34.8–46.6)
LYMPH%: 21.4 % (ref 14.0–48.0)
MCH: 31.6 pg (ref 26.0–34.0)
MCHC: 33.7 g/dL (ref 32.0–36.0)
MCV: 94 fL (ref 81–101)
MONO%: 8.4 % (ref 0.0–13.0)
NEUT%: 65.4 % (ref 39.6–80.0)
RDW: 14.1 % (ref 11.1–15.7)

## 2013-04-11 LAB — COMPREHENSIVE METABOLIC PANEL
AST: 18 U/L (ref 0–37)
Alkaline Phosphatase: 77 U/L (ref 39–117)
BUN: 9 mg/dL (ref 6–23)
Creatinine, Ser: 0.64 mg/dL (ref 0.50–1.10)

## 2013-04-11 LAB — LACTATE DEHYDROGENASE: LDH: 158 U/L (ref 94–250)

## 2013-04-11 MED ORDER — HYDROCOD POLST-CHLORPHEN POLST 10-8 MG/5ML PO LQCR: 5.0000 mL | Freq: Two times a day (BID) | ORAL | Status: DC | PRN

## 2013-04-11 MED ORDER — AMLODIPINE BESYLATE 10 MG PO TABS: 10.0000 mg | ORAL_TABLET | Freq: Every day | ORAL | Status: AC

## 2013-04-11 MED ORDER — LISINOPRIL 10 MG PO TABS: 10.0000 mg | ORAL_TABLET | Freq: Every day | ORAL | Status: DC

## 2013-04-11 NOTE — Progress Notes (Signed)
This office note has been dictated.

## 2013-04-18 NOTE — Progress Notes (Signed)
DIAGNOSES: 1. Stage I infiltrating ductal carcinoma of the right breast. 2. 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 3. Chronic pain secondary to mastectomies.  CURRENT THERAPY:  Observation.  INTERIM HISTORY:  Ms. Holstrom comes in for followup.  She, unfortunately, had repeat bladder sling surgery.  The was at [facility]. She was having a lot of leaking issues.  She is still having some issues with this.  I am unsure what else they can do for this.  She is still bothered by this right abdominal wall hernia.  Again, I am not sure if this is going to be operated on.  She is coughing.  Unfortunately, she is still smoking.  She probably has another bout of bronchitis.  We will go ahead and get her on some antibiotic.  I will give her a little bit of Tussionex cough medicine for this.  The pain issues that she has seem to be doing okay right now.  Again, she is having some pain issues with this bladder surgery that she had. This, I assume that [facility] will help out with I am sure.  She has had no leg swelling.  She has had no change in this rash that she has developed.  She said that she was told that she could go to [facility] and they could take care of this for her.  Of course, she has no primary care doctor.  She has no medication.  She does not qualify for Medicaid.  She has no insurance.  I am not sure if anybody will take her.  We are the ones who are just really trying to help her out.  As such, I went ahead and refilled her Norvasc and lisinopril prescriptions.  She has had no fever.  There has been no bleeding.  She has had no sinus congestion.  PHYSICAL EXAMINATION:  General:  This is a well-developed, well- nourished white female in no obvious distress.  Vital signs: Temperature of 98.4, pulse 71, respiratory rate 16, blood pressure 126/71.  Weight is 249.  Head and neck:  Normocephalic, atraumatic skull.  She has no ocular or oral lesions.  There are no  palpable cervical or supraclavicular lymph nodes.  Lungs:  Clear bilaterally. She has some slight scattered crackles and wheezes at the bases. Cardiac:  Regular rate and rhythm with a normal S1 and S2.  There are no murmurs, rubs, or bruits.  Chest wall:  Bilateral mastectomies.  There is still some tenderness on the chest wall bilaterally.  No erythema is noted.  There is no warmth noted.  Her mastectomy scars are well healed. There is no bilateral axillary adenopathy.  Abdomen:  Soft.  She has some tenderness in the lower abdomen from this bladder sling surgery. She has good bowel sounds.  There is no fluid wave.  She has a right flank hernia.  This is somewhat tender with reduction.  There is no palpable hepatosplenomegaly.  Back:  No tenderness over the spine, ribs, or hips.  She has some muscle spasms in the mid to lower thoracic spine. Extremities:  No clubbing, cyanosis or edema.  Skin:  Macular type rash that almost looks urticarial.  Neurological.  No focal neurological deficits.  LABORATORY STUDIES:  White cell count is 7.1, hemoglobin 13.7, hematocrit 40.6, platelet count 255.  IMPRESSION:  Ms. Mcmanigal is a 47 year old white female.  She has had multiple malignancies.  She has had a stage I breast cancer and a stage I renal cell  carcinoma.  In my mind, she is cured of these.  As always, pain issues are the biggest problem that she has.  We have her on oxycodone.  This does seem to help her out.  Again, she is having this intermittent pain right now from the bladder sling surgery.  She is complaining of some back issues.  This is something that probably needs to be dealt with by a family doctor, again, which she really cannot get.  I do not think we need any x-rays on the back right now.  I could not imagine this being anything related to her past malignancies.  Her smoking is not helping the situation.  She has a very tenuous family situation at home.  We will go ahead  and plan to get her back in another 3 months.  Again, cancer issues really are not a problem for her.  Pain issues from past cancer therapy certainly might pose long-term problems for her.  We will continue to pray hard for her.    ______________________________ Josph Macho, M.D. PRE/MEDQ  D:  04/11/2013  T:  04/18/2013  Job:  [id number]

## 2013-04-20 ENCOUNTER — Other Ambulatory Visit: Payer: Self-pay | Admitting: *Deleted

## 2013-04-20 DIAGNOSIS — F411 Generalized anxiety disorder: Secondary | ICD-10-CM

## 2013-04-20 DIAGNOSIS — N644 Mastodynia: Secondary | ICD-10-CM

## 2013-04-20 MED ORDER — OXYCODONE HCL 15 MG PO TABS: 15.0000 mg | ORAL_TABLET | Freq: Three times a day (TID) | ORAL | Status: DC | PRN

## 2013-04-20 MED ORDER — ALPRAZOLAM 1 MG PO TABS: 1.0000 mg | ORAL_TABLET | Freq: Two times a day (BID) | ORAL | Status: DC | PRN

## 2013-04-20 NOTE — Telephone Encounter (Signed)
Pt called today asking to pick up her Xanax and Oxy-IR prescriptions tomorrow around 1100.Marland Kitchen After reviewing her chart, she is due for her 2 week refill. Will route to Dr Myna Hidalgo for approval & signature then place at the front desk for pick up.

## 2013-04-27 ENCOUNTER — Telehealth (INDEPENDENT_AMBULATORY_CARE_PROVIDER_SITE_OTHER): Payer: Self-pay

## 2013-04-27 ENCOUNTER — Telehealth (INDEPENDENT_AMBULATORY_CARE_PROVIDER_SITE_OTHER): Payer: Self-pay | Admitting: *Deleted

## 2013-04-27 DIAGNOSIS — K432 Incisional hernia without obstruction or gangrene: Secondary | ICD-10-CM

## 2013-04-27 NOTE — Telephone Encounter (Signed)
Called pt back to let her know that the referral order has been place for [facility] and we will call her once we get an appt. The pt requested that we schedule the appt for 05/12/13 b/c she already has an appt with [facility] to see her urologist that day. I told pt that I would let our referral coordinator know of the date.

## 2013-04-27 NOTE — Telephone Encounter (Signed)
Message copied by Ethlyn Gallery on Thu Apr 27, 2013 10:37 AM ------      Message from: Rise Paganini      Created: Tue Apr 25, 2013 12:21 PM      Regarding: Jeananne Rama: [phone]       Patient stated that she would like to speak with you. She also mentioned an appointment even with no insurance. Please call to discuss. Thank you. ------

## 2013-04-27 NOTE — Telephone Encounter (Signed)
I called to inform pt of her appt at [facility] on 10/24 at 10:45.  They also moved her urology appt to 1:00pm right after the appt at the surgery clinic.  Pt given phone number of [phone] in case she needs to reschedule.

## 2013-04-27 NOTE — Telephone Encounter (Signed)
Returned pt's call. The pt states she still does not have insurance and she is wanting something done about her hernia. I advised pt that the pt has an outstanding balance with [place] that she is required to pay something towards this bill in order to see Dr Dwain Sarna b/c if she is going to get scheduled they will require her to pay her balance before scheduling surgery with Dr Dwain Sarna since this is an elective case. I advised pt that if something was going on with her bilateral mastectomies we would see right away with no questions asked but the incisional hernia is elective. The pt has applied for Medicaid but denied. The pt has applied for disability but still is waiting on the outcome of this which could take a long time. The pt is having more issues with the hernia getting larger and more difficult to bend over. The pt stated if she can't see Dr Dwain Sarna then she wants to be referred to [facility]. I advise pt that I would speak with Dr Dwain Sarna and get back in touch with her. The pt understands.  I spoke to Dr Dwain Sarna and he said to refer her to [facility] general surgery. I will work on this appt.

## 2013-05-05 ENCOUNTER — Other Ambulatory Visit: Payer: Self-pay | Admitting: *Deleted

## 2013-05-05 DIAGNOSIS — F411 Generalized anxiety disorder: Secondary | ICD-10-CM

## 2013-05-05 DIAGNOSIS — N644 Mastodynia: Secondary | ICD-10-CM

## 2013-05-05 MED ORDER — OXYCODONE HCL 15 MG PO TABS: 15.0000 mg | ORAL_TABLET | Freq: Three times a day (TID) | ORAL | Status: DC | PRN

## 2013-05-05 MED ORDER — ALPRAZOLAM 1 MG PO TABS: 1.0000 mg | ORAL_TABLET | Freq: Two times a day (BID) | ORAL | Status: DC | PRN

## 2013-05-05 NOTE — Telephone Encounter (Signed)
Pt called yesterday asking to pick up her Xanax and Oxy-IR prescriptions today. After reviewing her chart, she is due for her 2 week refill. Will route to Dr Myna Hidalgo for approval & signature then place at the front desk for pick up.

## 2013-05-18 ENCOUNTER — Other Ambulatory Visit: Payer: Self-pay | Admitting: *Deleted

## 2013-05-18 DIAGNOSIS — N644 Mastodynia: Secondary | ICD-10-CM

## 2013-05-18 DIAGNOSIS — F411 Generalized anxiety disorder: Secondary | ICD-10-CM

## 2013-05-18 MED ORDER — OXYCODONE HCL 15 MG PO TABS: 15.0000 mg | ORAL_TABLET | Freq: Three times a day (TID) | ORAL | Status: DC | PRN

## 2013-05-18 MED ORDER — ALPRAZOLAM 1 MG PO TABS: 1.0000 mg | ORAL_TABLET | Freq: Two times a day (BID) | ORAL | Status: DC | PRN

## 2013-05-18 NOTE — Telephone Encounter (Signed)
Pt called asking to pick up her Xanax and Oxy-IR prescriptions tomorrow. After reviewing her chart, she is due for her 2 week refill. Will route to Dr Myna Hidalgo for approval & signature then place at the front desk for pick up.

## 2013-06-01 ENCOUNTER — Other Ambulatory Visit: Payer: Self-pay | Admitting: *Deleted

## 2013-06-01 ENCOUNTER — Telehealth: Payer: Self-pay | Admitting: *Deleted

## 2013-06-01 DIAGNOSIS — N644 Mastodynia: Secondary | ICD-10-CM

## 2013-06-01 DIAGNOSIS — F411 Generalized anxiety disorder: Secondary | ICD-10-CM

## 2013-06-01 MED ORDER — OXYCODONE HCL 15 MG PO TABS: 15.0000 mg | ORAL_TABLET | Freq: Three times a day (TID) | ORAL | Status: DC | PRN

## 2013-06-02 ENCOUNTER — Other Ambulatory Visit: Payer: Self-pay | Admitting: *Deleted

## 2013-06-02 DIAGNOSIS — F411 Generalized anxiety disorder: Secondary | ICD-10-CM

## 2013-06-02 DIAGNOSIS — N644 Mastodynia: Secondary | ICD-10-CM

## 2013-06-02 MED ORDER — ALPRAZOLAM 1 MG PO TABS: 1.0000 mg | ORAL_TABLET | Freq: Two times a day (BID) | ORAL | Status: DC | PRN

## 2013-06-16 ENCOUNTER — Other Ambulatory Visit: Payer: Self-pay | Admitting: *Deleted

## 2013-06-16 DIAGNOSIS — F411 Generalized anxiety disorder: Secondary | ICD-10-CM

## 2013-06-16 DIAGNOSIS — N644 Mastodynia: Secondary | ICD-10-CM

## 2013-06-16 MED ORDER — ALPRAZOLAM 1 MG PO TABS: 1.0000 mg | ORAL_TABLET | Freq: Two times a day (BID) | ORAL | Status: DC | PRN

## 2013-06-16 MED ORDER — OXYCODONE HCL 15 MG PO TABS: 15.0000 mg | ORAL_TABLET | Freq: Three times a day (TID) | ORAL | Status: DC | PRN

## 2013-06-16 NOTE — Telephone Encounter (Signed)
Pt left a voicemail at [phone] stating she was coming to pick up her Xanax and Oxy-IR prescriptions today at 12:00. After reviewing her chart, she is due for her 2 week refill. Will route to Dr Myna Hidalgo for approval & signature then place at the front desk for pick up.

## 2013-06-26 NOTE — Progress Notes (Signed)
DIAGNOSES: 1. Stage I infiltrating ductal carcinoma of the right breast. 2. 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 3. Chronic pain secondary to mastectomies.  CURRENT THERAPY:  Observation.  INTERIM HISTORY:  Ms. Gignac comes in for a followup.  She unfortunately had a repeat bladder sling surgery.  This was at [facility]. She was having a lot of leaking issues.  She is still having some issues with this.  I am unsure what else they can do for this.  She is still bothered by this right abdominal wall hernia.  Again, I am not sure if this is going to be operated on.  She is coughing.  Unfortunately, she is still smoking.  She probably has another bout of bronchitis.  We will go ahead and get her on some antibiotics.  I will give her a little bit of Tussionex cough medicine for this.  The pain issues that she has seem to be doing okay right now.  Again, she is having some pain issues with this bladder surgery that she had. This, I assume that [facility] will help out with, I am sure.  She has had no leg swelling.  She has had no change in this rash that she has developed.  She states that she was told that she could go to [facility] and they could take care of this for her.  Of course, she has no primary care doctor.  She has no Medicaid.  She does not qualify for Medicaid.  She has no insurance.  I am not sure if anybody will take her.  We are the ones who are sort of trying to help her out.  As such, I went ahead and refilled her Norvasc and lisinopril prescriptions.  She has had no fever.  There has been no bleeding.  She has had no sinus congestion.  PHYSICAL EXAMINATION:  General:  This is a well-developed, well- nourished white female, in no obvious distress.  Vital signs: Temperature 98.4, pulse 71, respiratory rate 16, blood pressure 126/71, weight is 249.  Head and neck:  Normocephalic, atraumatic skull.  She has no ocular or oral lesions.  There are no palpable  cervical or supraclavicular lymph nodes.  Lungs:  Clear bilaterally.  She has some slight scattered crackles and wheezes at the bases.  Cardiac:  Regular rate and rhythm with a normal S1 and S2.  There are no murmurs, rubs, or bruits.  Chest wall:  Bilateral mastectomies.  There is still some tenderness on the chest wall bilaterally.  No erythema is noted.  There is no warmth noted.  Her mastectomy scars are well healed.  There is no bilateral axillary adenopathy.  Abdomen:  Soft.  She has some tenderness in the lower abdomen from this bladder sling surgery.  She has good bowel sounds.  There is no fluid wave.  She has a right flank hernia. This is somewhat tender with reduction.  There is no palpable hepatosplenomegaly.  Back:  No tenderness over the spine, ribs, or hips. She has some muscle spasms in the mid to lower thoracic spine. Extremities:  No clubbing, cyanosis, or edema.  Skin:  Macular type rash that almost is urticarial.  Neurological:  No focal neurological deficits.  LABORATORY STUDIES:  White cell count is 7.1, hemoglobin 13.7, hematocrit 30.6, platelet count 255.  IMPRESSION:  Ms. Raynor is a 47 year old white female.  She has had multiple malignancies.  She has had a stage I breast cancer and stage I renal  cell carcinoma.  In my mind, she is cured of these.  As always, pain issues are the biggest problem that she has.  We have her on oxycodone.  This does seem to help her out.  Again, she is having this intermittent pain right now from the bladder sling surgery.  She is complaining of some back issues.  This is something that probably needs to be dealt with by her family doctor, again, which she really cannot get.  I do not think we need any x-rays on the back right now.  I could not imagine this being anything related to her past malignancies.  Her smoking is not helping the situation.  She has a very tenuous family situation at home.  We will go ahead and plan  to get her back in another 3 months.  Again, cancer issues really are not a problem for her.  Pain issues from past cancer therapy certainly might pose long-term problems for her.  We will continue to pray hard for her.    ______________________________ Josph Macho, M.D. PRE/MEDQ  D:  04/11/2013  T:  06/25/2013  Job:  [id number]

## 2013-07-03 ENCOUNTER — Other Ambulatory Visit: Payer: Self-pay | Admitting: Nurse Practitioner

## 2013-07-03 DIAGNOSIS — C50919 Malignant neoplasm of unspecified site of unspecified female breast: Secondary | ICD-10-CM

## 2013-07-03 DIAGNOSIS — C649 Malignant neoplasm of unspecified kidney, except renal pelvis: Secondary | ICD-10-CM

## 2013-07-04 ENCOUNTER — Other Ambulatory Visit (HOSPITAL_BASED_OUTPATIENT_CLINIC_OR_DEPARTMENT_OTHER): Payer: Self-pay | Admitting: Lab

## 2013-07-04 ENCOUNTER — Ambulatory Visit (HOSPITAL_BASED_OUTPATIENT_CLINIC_OR_DEPARTMENT_OTHER): Payer: Self-pay | Admitting: Hematology & Oncology

## 2013-07-04 VITALS — BP 137/74 | HR 89 | Temp 98.2°F | Resp 14 | Ht 63.0 in | Wt 240.0 lb

## 2013-07-04 DIAGNOSIS — F17200 Nicotine dependence, unspecified, uncomplicated: Secondary | ICD-10-CM

## 2013-07-04 DIAGNOSIS — C50419 Malignant neoplasm of upper-outer quadrant of unspecified female breast: Secondary | ICD-10-CM

## 2013-07-04 DIAGNOSIS — F411 Generalized anxiety disorder: Secondary | ICD-10-CM

## 2013-07-04 DIAGNOSIS — G8929 Other chronic pain: Secondary | ICD-10-CM

## 2013-07-04 DIAGNOSIS — R079 Chest pain, unspecified: Secondary | ICD-10-CM

## 2013-07-04 DIAGNOSIS — C649 Malignant neoplasm of unspecified kidney, except renal pelvis: Secondary | ICD-10-CM

## 2013-07-04 DIAGNOSIS — N644 Mastodynia: Secondary | ICD-10-CM

## 2013-07-04 DIAGNOSIS — C50919 Malignant neoplasm of unspecified site of unspecified female breast: Secondary | ICD-10-CM

## 2013-07-04 LAB — CBC WITH DIFFERENTIAL (CANCER CENTER ONLY)
BASO#: 0 10*3/uL (ref 0.0–0.2)
BASO%: 0.2 % (ref 0.0–2.0)
EOS%: 1.2 % (ref 0.0–7.0)
HCT: 45.3 % (ref 34.8–46.6)
HGB: 15.6 g/dL (ref 11.6–15.9)
LYMPH%: 15.1 % (ref 14.0–48.0)
MCHC: 34.4 g/dL (ref 32.0–36.0)
MCV: 92 fL (ref 81–101)
MONO#: 0.8 10*3/uL (ref 0.1–0.9)
NEUT#: 10 10*3/uL — ABNORMAL HIGH (ref 1.5–6.5)
NEUT%: 77 % (ref 39.6–80.0)
RDW: 13.1 % (ref 11.1–15.7)

## 2013-07-04 LAB — LACTATE DEHYDROGENASE: LDH: 121 U/L (ref 94–250)

## 2013-07-04 LAB — COMPREHENSIVE METABOLIC PANEL
ALT: 12 U/L (ref 0–35)
BUN: 11 mg/dL (ref 6–23)
CO2: 24 mEq/L (ref 19–32)
Calcium: 9.4 mg/dL (ref 8.4–10.5)
Chloride: 103 mEq/L (ref 96–112)
Creatinine, Ser: 0.77 mg/dL (ref 0.50–1.10)
Glucose, Bld: 106 mg/dL — ABNORMAL HIGH (ref 70–99)
Total Bilirubin: 0.5 mg/dL (ref 0.3–1.2)
Total Protein: 7.4 g/dL (ref 6.0–8.3)

## 2013-07-04 MED ORDER — ALPRAZOLAM 1 MG PO TABS: 1.0000 mg | ORAL_TABLET | Freq: Two times a day (BID) | ORAL | Status: DC | PRN

## 2013-07-04 MED ORDER — OXYCODONE HCL 15 MG PO TABS: 15.0000 mg | ORAL_TABLET | Freq: Three times a day (TID) | ORAL | Status: DC | PRN

## 2013-07-04 NOTE — Progress Notes (Signed)
This office note has been dictated.

## 2013-07-05 NOTE — Progress Notes (Signed)
DIAGNOSES: 1. 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 2. 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 3. Chronic pain syndrome.  CURRENT THERAPY:  Observation.  INTERIM HISTORY:  [name] comes in for followup.  Unfortunately, she has had issues with family.  She unfortunately found out that her boyfriend was cheating on her.  She now has moved out.  She actually says she feels better since she left him.  She is still having some bladder issues.  She is having her hernia issues.  She is going to go see a doctor at [facility] regarding this.  She still has pain on the chest wall.  I think this will be a chronic problem for her.  She has had no problems with bleeding or bruising.  She has had issues with smoking a little bit more right now.  She has had no leg swelling.  She has had no rashes.  PHYSICAL EXAMINATION:  On her physical exam, this is an obese white female in no obvious distress.  Vital signs show temperature of 98.2, pulse 89, respiratory rate 14, blood pressure 137/74, weight is 240 pounds.  Head and neck exam shows a  normocephalic, atraumatic skull. She has no ocular or oral lesions.  There are no palpable cervical or supraclavicular lymph nodes.  Lungs are clear bilaterally.  Cardiac Exam:  Regular rate and rhythm with a normal S1 and S2.  There are no murmurs, rubs, or bruits.  Abdomen is soft.  She has good bowel sounds. There is no fluid wave.  There is no palpable hepatosplenomegaly.  Chest wall exam shows bilateral mastectomies.  She has no erythema or nodularity on the chest wall bilaterally.  She has well-healed mastectomies.  She does have tenderness to palpation on both sides of the chest wall.  There is no bilateral axillary adenopathy.  Extremities shows no clubbing, cyanosis, or edema.  Skin exam still shows these macular lesions on her back.  They have been biopsied before. Neurologic Exam:  No focal neurological deficits.  LABORATORY  STUDIES:  White cell count is 13, hemoglobin 15.6, hematocrit 45.3, platelet count 265.  IMPRESSION:  [name] is a very charming 47 year old white female. She has a past history of stage I infiltrating ductal carcinoma of the right breast.  This is probably about 6 or 7 years ago.  We tried to get her on hormonal therapy, but she really was not tolerant of this.  The patient had a renal cell carcinoma of the right kidney.  This was resected at [facility].  She has had bladder surgery.  Again, I do not see any evidence of recurrent disease.  We will go ahead and plan to get her back in another 3 months.  She was diagnosed with breast cancer probably 5 years ago.    ______________________________ Josph Macho, M.D. PRE/MEDQ  D:  07/04/2013  T:  07/05/2013  Job:  [id number]

## 2013-08-02 ENCOUNTER — Other Ambulatory Visit: Payer: Self-pay | Admitting: *Deleted

## 2013-08-02 DIAGNOSIS — N644 Mastodynia: Secondary | ICD-10-CM

## 2013-08-02 DIAGNOSIS — F411 Generalized anxiety disorder: Secondary | ICD-10-CM

## 2013-08-02 DIAGNOSIS — R079 Chest pain, unspecified: Secondary | ICD-10-CM

## 2013-08-02 DIAGNOSIS — G8929 Other chronic pain: Secondary | ICD-10-CM

## 2013-08-02 MED ORDER — ALPRAZOLAM 1 MG PO TABS: 1.0000 mg | ORAL_TABLET | Freq: Two times a day (BID) | ORAL | Status: DC | PRN

## 2013-08-02 MED ORDER — OXYCODONE HCL 15 MG PO TABS: 15.0000 mg | ORAL_TABLET | Freq: Three times a day (TID) | ORAL | Status: DC | PRN

## 2013-09-01 ENCOUNTER — Other Ambulatory Visit: Payer: Self-pay | Admitting: Nurse Practitioner

## 2013-09-01 DIAGNOSIS — N644 Mastodynia: Secondary | ICD-10-CM

## 2013-09-01 DIAGNOSIS — R079 Chest pain, unspecified: Secondary | ICD-10-CM

## 2013-09-01 DIAGNOSIS — F411 Generalized anxiety disorder: Secondary | ICD-10-CM

## 2013-09-01 DIAGNOSIS — G8929 Other chronic pain: Secondary | ICD-10-CM

## 2013-09-01 MED ORDER — OXYCODONE HCL 15 MG PO TABS: 15.0000 mg | ORAL_TABLET | Freq: Three times a day (TID) | ORAL | Status: DC | PRN

## 2013-09-01 MED ORDER — ALPRAZOLAM 1 MG PO TABS: 1.0000 mg | ORAL_TABLET | Freq: Two times a day (BID) | ORAL | Status: DC | PRN

## 2013-10-02 ENCOUNTER — Other Ambulatory Visit (HOSPITAL_BASED_OUTPATIENT_CLINIC_OR_DEPARTMENT_OTHER): Payer: Self-pay | Admitting: Lab

## 2013-10-02 ENCOUNTER — Ambulatory Visit (HOSPITAL_BASED_OUTPATIENT_CLINIC_OR_DEPARTMENT_OTHER): Payer: Self-pay | Admitting: Hematology & Oncology

## 2013-10-02 VITALS — BP 157/100 | HR 62 | Temp 98.1°F | Resp 20 | Wt 245.0 lb

## 2013-10-02 DIAGNOSIS — R079 Chest pain, unspecified: Secondary | ICD-10-CM

## 2013-10-02 DIAGNOSIS — Z853 Personal history of malignant neoplasm of breast: Secondary | ICD-10-CM

## 2013-10-02 DIAGNOSIS — N644 Mastodynia: Secondary | ICD-10-CM

## 2013-10-02 DIAGNOSIS — F411 Generalized anxiety disorder: Secondary | ICD-10-CM

## 2013-10-02 DIAGNOSIS — C649 Malignant neoplasm of unspecified kidney, except renal pelvis: Secondary | ICD-10-CM

## 2013-10-02 DIAGNOSIS — G8929 Other chronic pain: Secondary | ICD-10-CM

## 2013-10-02 LAB — CBC WITH DIFFERENTIAL (CANCER CENTER ONLY)
BASO#: 0 10*3/uL (ref 0.0–0.2)
BASO%: 0.3 % (ref 0.0–2.0)
EOS%: 5.3 % (ref 0.0–7.0)
Eosinophils Absolute: 0.4 10*3/uL (ref 0.0–0.5)
HEMATOCRIT: 41.3 % (ref 34.8–46.6)
HGB: 14 g/dL (ref 11.6–15.9)
LYMPH#: 1.4 10*3/uL (ref 0.9–3.3)
LYMPH%: 18 % (ref 14.0–48.0)
MCH: 31.5 pg (ref 26.0–34.0)
MCHC: 33.9 g/dL (ref 32.0–36.0)
MCV: 93 fL (ref 81–101)
MONO#: 0.5 10*3/uL (ref 0.1–0.9)
MONO%: 7 % (ref 0.0–13.0)
NEUT%: 69.4 % (ref 39.6–80.0)
NEUTROS ABS: 5.3 10*3/uL (ref 1.5–6.5)
Platelets: 232 10*3/uL (ref 145–400)
RBC: 4.45 10*6/uL (ref 3.70–5.32)
RDW: 12.8 % (ref 11.1–15.7)
WBC: 7.7 10*3/uL (ref 3.9–10.0)

## 2013-10-02 LAB — COMPREHENSIVE METABOLIC PANEL
ALBUMIN: 3.7 g/dL (ref 3.5–5.2)
ALT: 15 U/L (ref 0–35)
AST: 16 U/L (ref 0–37)
Alkaline Phosphatase: 81 U/L (ref 39–117)
BUN: 7 mg/dL (ref 6–23)
CALCIUM: 9.3 mg/dL (ref 8.4–10.5)
CHLORIDE: 101 meq/L (ref 96–112)
CO2: 30 meq/L (ref 19–32)
Creatinine, Ser: 0.53 mg/dL (ref 0.50–1.10)
Glucose, Bld: 136 mg/dL — ABNORMAL HIGH (ref 70–99)
POTASSIUM: 3.7 meq/L (ref 3.5–5.3)
Sodium: 140 mEq/L (ref 135–145)
TOTAL PROTEIN: 6.3 g/dL (ref 6.0–8.3)
Total Bilirubin: 0.3 mg/dL (ref 0.2–1.2)

## 2013-10-02 MED ORDER — OXYCODONE HCL 15 MG PO TABS: 15.0000 mg | ORAL_TABLET | ORAL | Status: DC | PRN

## 2013-10-02 MED ORDER — ALPRAZOLAM 1 MG PO TABS: 1.0000 mg | ORAL_TABLET | Freq: Two times a day (BID) | ORAL | Status: DC | PRN

## 2013-10-02 NOTE — Progress Notes (Signed)
  DIAGNOSIS: 1. 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 2. 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  3. Chronic pain syndrome.   CURRENT THERAPY:  Observation   INTERIM HISTORY:  She comes in for followup. Last him back in December. At that point on, there was a lot of issues with her family. This has gotten somewhat better for her. She is not dealing with her family although much right now. This seems to make her quite happy. I understand is given all the family dynamics that she's gone through.    She is still smoking. She's down to 6 cigarettes a day. She tried to gradually cut back.    She still does not have a family doctor. Apparently Medicaid is an issue.    Her pain seems to be under fairly good control. She is on oxycodone which does seem to help her.    She has most abdominal pain. She has some hernias from past surgery. She does not want more surgery for these.    She's had no fever. She's had no change in bowel or bladder habits. She's had no leg swelling. Said the headache.   PHYSICAL EXAMINATION:  Mildly obese white female in no obvious distress. Her vital signs are temperature 98.1. Blood pressure 157/100. Pulse is 62. Weight is 245 pounds. Head and neck exam shows no ocular or oral lesions. Shows no adenopathy in the neck. Lungs are with some scattered crackles bilaterally. Cardiac exam regular in rhythm with a normal S1 and S2. There are no murmurs rubs or bruits. Chest wall exam showed bilateral mastectomies. These are well healed. No erythema noted. No bilateral axillary adenopathy. Abdomen is soft. Mildly obese and shows laparotomy scars. There is no fluid wave. There is tenderness in the right flank. No hernia is noted. No palpable liver or spleen tip. Extremities shows no clubbing cyanosis or edema. His good range motion of her joints. Skin exam no discrete rashes. Neurological exam non-focal.   LABORATORY STUDIES:  White cell count is  7.7. Hemoglobin 1441.3 platelet count 232.   IMPRESSION:  [name] is a 48 year old white female. She had localized breast cancer. This is stage I disease. She is now in remission for about 7 years.                                                                                  We will go ahead and plan to get her back in anotherShe has other issues that thankfully, we don't have to worry about right now.    We will plan to get her back in another 3 months.    We will fill her medications today.   [name], MD 10/02/2013

## 2013-11-01 ENCOUNTER — Other Ambulatory Visit: Payer: Self-pay | Admitting: *Deleted

## 2013-11-01 DIAGNOSIS — F411 Generalized anxiety disorder: Secondary | ICD-10-CM

## 2013-11-01 DIAGNOSIS — G8929 Other chronic pain: Secondary | ICD-10-CM

## 2013-11-01 DIAGNOSIS — R079 Chest pain, unspecified: Secondary | ICD-10-CM

## 2013-11-01 DIAGNOSIS — N644 Mastodynia: Secondary | ICD-10-CM

## 2013-11-01 MED ORDER — ALPRAZOLAM 1 MG PO TABS: 1.0000 mg | ORAL_TABLET | Freq: Two times a day (BID) | ORAL | Status: DC | PRN

## 2013-11-01 MED ORDER — OXYCODONE HCL 15 MG PO TABS: 15.0000 mg | ORAL_TABLET | ORAL | Status: DC | PRN

## 2013-11-30 ENCOUNTER — Other Ambulatory Visit: Payer: Self-pay | Admitting: *Deleted

## 2013-11-30 DIAGNOSIS — G8929 Other chronic pain: Secondary | ICD-10-CM

## 2013-11-30 DIAGNOSIS — F411 Generalized anxiety disorder: Secondary | ICD-10-CM

## 2013-11-30 DIAGNOSIS — N644 Mastodynia: Secondary | ICD-10-CM

## 2013-11-30 DIAGNOSIS — R079 Chest pain, unspecified: Secondary | ICD-10-CM

## 2013-11-30 MED ORDER — OXYCODONE HCL 15 MG PO TABS: 15.0000 mg | ORAL_TABLET | ORAL | Status: DC | PRN

## 2013-11-30 MED ORDER — ALPRAZOLAM 1 MG PO TABS: 1.0000 mg | ORAL_TABLET | Freq: Two times a day (BID) | ORAL | Status: DC | PRN

## 2014-01-02 ENCOUNTER — Ambulatory Visit (HOSPITAL_BASED_OUTPATIENT_CLINIC_OR_DEPARTMENT_OTHER): Payer: Self-pay | Admitting: Hematology & Oncology

## 2014-01-02 ENCOUNTER — Encounter: Payer: Self-pay | Admitting: Hematology & Oncology

## 2014-01-02 ENCOUNTER — Other Ambulatory Visit (HOSPITAL_BASED_OUTPATIENT_CLINIC_OR_DEPARTMENT_OTHER): Payer: Self-pay | Admitting: Lab

## 2014-01-02 VITALS — BP 138/73 | HR 64 | Temp 97.9°F | Resp 14 | Ht 63.0 in | Wt 251.0 lb

## 2014-01-02 DIAGNOSIS — Z853 Personal history of malignant neoplasm of breast: Secondary | ICD-10-CM

## 2014-01-02 DIAGNOSIS — F411 Generalized anxiety disorder: Secondary | ICD-10-CM

## 2014-01-02 DIAGNOSIS — C649 Malignant neoplasm of unspecified kidney, except renal pelvis: Secondary | ICD-10-CM

## 2014-01-02 DIAGNOSIS — E559 Vitamin D deficiency, unspecified: Secondary | ICD-10-CM

## 2014-01-02 DIAGNOSIS — F17200 Nicotine dependence, unspecified, uncomplicated: Secondary | ICD-10-CM

## 2014-01-02 DIAGNOSIS — G8929 Other chronic pain: Secondary | ICD-10-CM

## 2014-01-02 DIAGNOSIS — R079 Chest pain, unspecified: Secondary | ICD-10-CM

## 2014-01-02 DIAGNOSIS — N644 Mastodynia: Secondary | ICD-10-CM

## 2014-01-02 LAB — CBC WITH DIFFERENTIAL (CANCER CENTER ONLY)
BASO#: 0 10*3/uL (ref 0.0–0.2)
BASO%: 0.3 % (ref 0.0–2.0)
EOS%: 5.2 % (ref 0.0–7.0)
Eosinophils Absolute: 0.4 10*3/uL (ref 0.0–0.5)
HEMATOCRIT: 42 % (ref 34.8–46.6)
HGB: 14.5 g/dL (ref 11.6–15.9)
LYMPH#: 2.2 10*3/uL (ref 0.9–3.3)
LYMPH%: 27.2 % (ref 14.0–48.0)
MCH: 31.8 pg (ref 26.0–34.0)
MCHC: 34.5 g/dL (ref 32.0–36.0)
MCV: 92 fL (ref 81–101)
MONO#: 0.6 10*3/uL (ref 0.1–0.9)
MONO%: 7.2 % (ref 0.0–13.0)
NEUT#: 4.8 10*3/uL (ref 1.5–6.5)
NEUT%: 60.1 % (ref 39.6–80.0)
PLATELETS: 233 10*3/uL (ref 145–400)
RBC: 4.56 10*6/uL (ref 3.70–5.32)
RDW: 13 % (ref 11.1–15.7)
WBC: 7.9 10*3/uL (ref 3.9–10.0)

## 2014-01-02 LAB — COMPREHENSIVE METABOLIC PANEL
ALK PHOS: 96 U/L (ref 39–117)
ALT: 24 U/L (ref 0–35)
AST: 25 U/L (ref 0–37)
Albumin: 3.8 g/dL (ref 3.5–5.2)
BILIRUBIN TOTAL: 0.5 mg/dL (ref 0.2–1.2)
BUN: 11 mg/dL (ref 6–23)
CO2: 27 meq/L (ref 19–32)
CREATININE: 0.71 mg/dL (ref 0.50–1.10)
Calcium: 9.4 mg/dL (ref 8.4–10.5)
Chloride: 102 mEq/L (ref 96–112)
Glucose, Bld: 159 mg/dL — ABNORMAL HIGH (ref 70–99)
Potassium: 3.9 mEq/L (ref 3.5–5.3)
Sodium: 139 mEq/L (ref 135–145)
TOTAL PROTEIN: 6.8 g/dL (ref 6.0–8.3)

## 2014-01-02 MED ORDER — ALPRAZOLAM 1 MG PO TABS: 1.0000 mg | ORAL_TABLET | Freq: Two times a day (BID) | ORAL | Status: DC | PRN

## 2014-01-02 MED ORDER — OXYCODONE HCL 15 MG PO TABS: 15.0000 mg | ORAL_TABLET | ORAL | Status: DC | PRN

## 2014-01-02 NOTE — Progress Notes (Signed)
Hematology and Oncology Follow Up Visit  [name] [id number] 20-Sep-1965 48 y.o. 01/02/2014   Principle Diagnosis:  1. 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 2. 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  3. Chronic pain syndrome.  Current Therapy:    Observation     Interim History:  Ms.  Steptoe is back for followup. She has been doing fairly well since we saw her 3 months ago. She still has a chronic pain issues. The oxycodone does seem to help.  She's having more problems with a lateral abdominal wall hernia on the right. She has a large hernia with her: Bulging through. She used to go back to [place] to have this looked at. She had a surgery other for her kidney and this is where the hernia is coming from. However, she has not yet made an appointment.  As always, there is from a in her family. Her ex-boyfriend is now doing drugs. She kicked him out. Your he has a girlfriend. He has lost quite a bit of weight. She just will not do with him any longer. She clearly may the right move on this.  She has had chronic chest wall pain. This is some that she just lives with. She had bilateral mastectomies because of the pain. This has helped a little bit. She has more pain over on the right side the left.  He still smoking. She may smoke 2 or 3 cigarettes a day.  Medications: Current outpatient prescriptions:ALPRAZolam (XANAX) 1 MG tablet, Take 1 tablet (1 mg total) by mouth 2 (two) times daily as needed for anxiety., Disp: 60 tablet, Rfl: 0;  amLODipine (NORVASC) 10 MG tablet, Take 1 tablet (10 mg total) by mouth daily before breakfast., Disp: 30 tablet, Rfl: 3;  lansoprazole (PREVACID) 30 MG capsule, Take 30 mg by mouth 2 (two) times daily. , Disp: , Rfl:  lisinopril (PRINIVIL,ZESTRIL) 10 MG tablet, Take 1 tablet (10 mg total) by mouth daily before breakfast., Disp: 30 tablet, Rfl: 3;  oxyCODONE (ROXICODONE) 15 MG immediate release tablet, Take 1 tablet (15 mg total) by  mouth every 4 (four) hours as needed., Disp: 90 tablet, Rfl: 0;  chlorpheniramine-HYDROcodone (TUSSIONEX) 10-8 MG/5ML LQCR, Take 5 mLs by mouth every 12 (twelve) hours as needed., Disp: 140 mL, Rfl: 0  Allergies:  Allergies  Allergen Reactions  . Penicillins Anaphylaxis and Hives  . Sulfur Anaphylaxis and Hives  . Darvocet [Propoxyphene N-Acetaminophen]     Nausea, hives, and itching    . Doxycycline Nausea And Vomiting  . Morphine And Related Nausea Only  . Naproxen Nausea And Vomiting  . Nsaids Nausea And Vomiting  . Tape Other (See Comments)    Rips skin  . Toradol [Ketorolac Tromethamine] Nausea And Vomiting  . Ultracet [Tramadol-Acetaminophen] Hives, Itching and Nausea And Vomiting  . Ultram [Tramadol Hcl]     Nausea, hives, itching     Past Medical History, Surgical history, Social history, and Family History were reviewed and updated.  Review of Systems: As above  Physical Exam:  height is 5\' 3"  (1.6 m) and weight is 251 lb (113.853 kg). Her oral temperature is 97.9 F (36.6 C). Her blood pressure is 138/73 and her pulse is 64. Her respiration is 14.   Obese white female. Head and neck exam shows no ocular or oral lesions. There are no palpable cervical or supraclavicular lymph nodes. Lungs are clear. Cardiac exam regular in rhythm with no murmurs rubs or bruits. This  was after the bilateral mastectomies. She has tenderness over the chest wall bilaterally. No nodules are noted. No rash is noted. There is no axillary lymph nodes bilaterally. Abdomen soft. She is obese. She is a large right lateral abdominal wall hernia. She has laparotomy scar is well-healed. There is no palpable hepatomegaly. There is no spleen tip. Back exam no tenderness over the spine. Telemetry shows no clubbing cyanosis or edema. His good range motion of her joints. Has good strength. Skin exam shows this chronic macular type rash on her back. Neurological exam is nonfocal.  Lab Results  Component Value  Date   WBC 7.9 01/02/2014   HGB 14.5 01/02/2014   HCT 42.0 01/02/2014   MCV 92 01/02/2014   PLT 233 01/02/2014     Chemistry      Component Value Date/Time   NA 140 10/02/2013 0829   K 3.7 10/02/2013 0829   CL 101 10/02/2013 0829   CO2 30 10/02/2013 0829   BUN 7 10/02/2013 0829   CREATININE 0.53 10/02/2013 0829      Component Value Date/Time   CALCIUM 9.3 10/02/2013 0829   ALKPHOS 81 10/02/2013 0829   AST 16 10/02/2013 0829   ALT 15 10/02/2013 0829   BILITOT 0.3 10/02/2013 0829         Impression and Plan: [name] is 48 year old white female with a history of stage I ductal carcinoma of the right breast. This was probably 7 years ago. She did have a low Oncotype score (7). She was on hormonal therapy but did not tolerate this well. She then had a stage I renal cell cancer of the right kidney resected. This probably was about 3 or 4 years ago.  From my point of view, I don't see any issues with cancer recurrence.  We will go ahead and putting her back in 3 more months.  I will check that and she is having adequate vitamin D level.   [name], MD 6/16/201510:11 AM

## 2014-01-03 ENCOUNTER — Ambulatory Visit: Payer: Self-pay | Admitting: Hematology & Oncology

## 2014-01-03 ENCOUNTER — Other Ambulatory Visit: Payer: Self-pay | Admitting: Lab

## 2014-01-30 ENCOUNTER — Other Ambulatory Visit: Payer: Self-pay | Admitting: *Deleted

## 2014-01-30 DIAGNOSIS — R079 Chest pain, unspecified: Secondary | ICD-10-CM

## 2014-01-30 DIAGNOSIS — E559 Vitamin D deficiency, unspecified: Secondary | ICD-10-CM

## 2014-01-30 DIAGNOSIS — F411 Generalized anxiety disorder: Secondary | ICD-10-CM

## 2014-01-30 DIAGNOSIS — N644 Mastodynia: Secondary | ICD-10-CM

## 2014-01-30 DIAGNOSIS — G8929 Other chronic pain: Secondary | ICD-10-CM

## 2014-01-30 MED ORDER — ALPRAZOLAM 1 MG PO TABS: 1.0000 mg | ORAL_TABLET | Freq: Two times a day (BID) | ORAL | Status: DC | PRN

## 2014-01-30 MED ORDER — OXYCODONE HCL 15 MG PO TABS: 15.0000 mg | ORAL_TABLET | ORAL | Status: DC | PRN

## 2014-03-01 ENCOUNTER — Other Ambulatory Visit: Payer: Self-pay

## 2014-03-01 DIAGNOSIS — G8929 Other chronic pain: Secondary | ICD-10-CM

## 2014-03-01 DIAGNOSIS — E559 Vitamin D deficiency, unspecified: Secondary | ICD-10-CM

## 2014-03-01 DIAGNOSIS — F411 Generalized anxiety disorder: Secondary | ICD-10-CM

## 2014-03-01 DIAGNOSIS — R079 Chest pain, unspecified: Secondary | ICD-10-CM

## 2014-03-01 DIAGNOSIS — N644 Mastodynia: Secondary | ICD-10-CM

## 2014-03-01 MED ORDER — ALPRAZOLAM 1 MG PO TABS: 1.0000 mg | ORAL_TABLET | Freq: Two times a day (BID) | ORAL | Status: DC | PRN

## 2014-03-01 MED ORDER — OXYCODONE HCL 15 MG PO TABS: 15.0000 mg | ORAL_TABLET | ORAL | Status: DC | PRN

## 2014-04-04 ENCOUNTER — Other Ambulatory Visit (HOSPITAL_BASED_OUTPATIENT_CLINIC_OR_DEPARTMENT_OTHER): Payer: Self-pay | Admitting: Lab

## 2014-04-04 ENCOUNTER — Encounter: Payer: Self-pay | Admitting: Hematology & Oncology

## 2014-04-04 ENCOUNTER — Ambulatory Visit (HOSPITAL_BASED_OUTPATIENT_CLINIC_OR_DEPARTMENT_OTHER): Payer: Self-pay | Admitting: Hematology & Oncology

## 2014-04-04 VITALS — BP 150/92 | HR 73 | Temp 97.7°F | Resp 16 | Ht 63.0 in | Wt 263.0 lb

## 2014-04-04 DIAGNOSIS — Z853 Personal history of malignant neoplasm of breast: Secondary | ICD-10-CM

## 2014-04-04 DIAGNOSIS — R079 Chest pain, unspecified: Secondary | ICD-10-CM

## 2014-04-04 DIAGNOSIS — E559 Vitamin D deficiency, unspecified: Secondary | ICD-10-CM

## 2014-04-04 DIAGNOSIS — G8929 Other chronic pain: Secondary | ICD-10-CM

## 2014-04-04 DIAGNOSIS — F411 Generalized anxiety disorder: Secondary | ICD-10-CM

## 2014-04-04 DIAGNOSIS — N644 Mastodynia: Secondary | ICD-10-CM

## 2014-04-04 LAB — CBC WITH DIFFERENTIAL (CANCER CENTER ONLY)
BASO#: 0 10*3/uL (ref 0.0–0.2)
BASO%: 0.4 % (ref 0.0–2.0)
EOS%: 4.9 % (ref 0.0–7.0)
Eosinophils Absolute: 0.4 10*3/uL (ref 0.0–0.5)
HEMATOCRIT: 40.7 % (ref 34.8–46.6)
HGB: 14.2 g/dL (ref 11.6–15.9)
LYMPH#: 1.6 10*3/uL (ref 0.9–3.3)
LYMPH%: 21.7 % (ref 14.0–48.0)
MCH: 32.6 pg (ref 26.0–34.0)
MCHC: 34.9 g/dL (ref 32.0–36.0)
MCV: 93 fL (ref 81–101)
MONO#: 0.5 10*3/uL (ref 0.1–0.9)
MONO%: 6.8 % (ref 0.0–13.0)
NEUT#: 4.9 10*3/uL (ref 1.5–6.5)
NEUT%: 66.2 % (ref 39.6–80.0)
PLATELETS: 223 10*3/uL (ref 145–400)
RBC: 4.36 10*6/uL (ref 3.70–5.32)
RDW: 13.1 % (ref 11.1–15.7)
WBC: 7.4 10*3/uL (ref 3.9–10.0)

## 2014-04-04 MED ORDER — OXYCODONE HCL 15 MG PO TABS: 15.0000 mg | ORAL_TABLET | ORAL | Status: DC | PRN

## 2014-04-04 MED ORDER — ALPRAZOLAM 1 MG PO TABS: 1.0000 mg | ORAL_TABLET | Freq: Two times a day (BID) | ORAL | Status: DC | PRN

## 2014-04-04 NOTE — Progress Notes (Signed)
Hematology and Oncology Follow Up Visit  [name] [id number] 1966-03-09 48 y.o. 04/04/2014   Principle Diagnosis:  1. 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 2. 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 3. Chronic pain syndrome.  Current Therapy:    Observation     Interim History:  [name] is back for followup. We last saw her back in June. She's had a pretty decent summer. She still has problems with pain. She is yet to go to [facility] to look in treating the hernias that she has. She's had problems with her bladder. She has urinary frequency. Again, she has been seen at [facility] for this. She does not want to go back out there and be looked at again. She does not want any further bladder intervention.  She's had no issues with respect to recurrent breast cancer.  The right mastectomy is little bit tender. Assess would arrest. Patient had no cough. She still smokes a little bit. Patient had no nausea or vomiting. There's been no bleeding. She has had no leg swelling. Patient has a rash which is been chronic. This has been biopsied before and has not been found be of any obvious etiology.  She's had no fever. His been no headache.  Overall, her performance status is ECOG 1.  Medications: Current outpatient prescriptions:ALPRAZolam (XANAX) 1 MG tablet, Take 1 tablet (1 mg total) by mouth 2 (two) times daily as needed for anxiety., Disp: 60 tablet, Rfl: 0;  amLODipine (NORVASC) 10 MG tablet, Take 1 tablet (10 mg total) by mouth daily before breakfast., Disp: 30 tablet, Rfl: 3;  lansoprazole (PREVACID) 30 MG capsule, Take 30 mg by mouth 2 (two) times daily. , Disp: , Rfl:  lisinopril (PRINIVIL,ZESTRIL) 10 MG tablet, Take 1 tablet (10 mg total) by mouth daily before breakfast., Disp: 30 tablet, Rfl: 3;  oxyCODONE (ROXICODONE) 15 MG immediate release tablet, Take 1 tablet (15 mg total) by mouth every 4 (four) hours as needed., Disp: 90 tablet, Rfl: 0;   chlorpheniramine-HYDROcodone (TUSSIONEX) 10-8 MG/5ML LQCR, Take 5 mLs by mouth every 12 (twelve) hours as needed., Disp: 140 mL, Rfl: 0  Allergies:  Allergies  Allergen Reactions  . Penicillins Anaphylaxis and Hives  . Sulfur Anaphylaxis and Hives  . Darvocet [Propoxyphene N-Acetaminophen]     Nausea, hives, and itching    . Doxycycline Nausea And Vomiting  . Morphine And Related Nausea Only  . Naproxen Nausea And Vomiting  . Nsaids Nausea And Vomiting  . Tape Other (See Comments)    Rips skin  . Toradol [Ketorolac Tromethamine] Nausea And Vomiting  . Ultracet [Tramadol-Acetaminophen] Hives, Itching and Nausea And Vomiting  . Ultram [Tramadol Hcl]     Nausea, hives, itching     Past Medical History, Surgical history, Social history, and Family History were reviewed and updated.  Review of Systems: As above  Physical Exam:  height is 5\' 3"  (1.6 m) and weight is 263 lb (119.296 kg). Her oral temperature is 97.7 F (36.5 C). Her blood pressure is 150/92 and her pulse is 73. Her respiration is 16.   Obese white female. Head and neck exam shows no ocular or oral lesions. There are no palpable cervical or supraclavicular lymph nodes. Lungs are clear. Cardiac exam regular in rhythm with no murmurs rubs or bruits. This was after the bilateral mastectomies. She has tenderness over the chest wall bilaterally. No nodules are noted. No rash is noted. There is no axillary  lymph nodes bilaterally. Abdomen soft. She is obese. She is a large right lateral abdominal wall hernia. She has laparotomy scar is well-healed. There is no palpable hepatomegaly. There is no spleen tip. Back exam no tenderness over the spine. Telemetry shows no clubbing cyanosis or edema. His good range motion of her joints. Has good strength. Skin exam shows this chronic macular type rash on her back. Neurological exam is nonfocal.  Lab Results  Component Value Date   WBC 7.4 04/04/2014   HGB 14.2 04/04/2014   HCT 40.7  04/04/2014   MCV 93 04/04/2014   PLT 223 04/04/2014     Chemistry      Component Value Date/Time   NA 139 01/02/2014 0844   K 3.9 01/02/2014 0844   CL 102 01/02/2014 0844   CO2 27 01/02/2014 0844   BUN 11 01/02/2014 0844   CREATININE 0.71 01/02/2014 0844      Component Value Date/Time   CALCIUM 9.4 01/02/2014 0844   ALKPHOS 96 01/02/2014 0844   AST 25 01/02/2014 0844   ALT 24 01/02/2014 0844   BILITOT 0.5 01/02/2014 0844         Impression and Plan: [name] is 48 year old white female. She is a past history of localized breast cancer. She's had bilateral mastectomies. She was on hormonal therapy. She really did not take this like she needed to. Her she signs a had no issues with recurrent. She's not taking vitamin D. I told in the past that she really needs to take vitamin D. However, she will there do this. I told her to take 2000 unitsa day.  I also told to take one baby aspirin a day. I this will be helpful for her. She doesn't smoke. I think this will limit her cardiac and vascular issues.  I will plan to get her back in 3 months.  I did refill her medications.   [name], MD 9/16/20159:03 AM

## 2014-04-05 LAB — COMPREHENSIVE METABOLIC PANEL
ALK PHOS: 104 U/L (ref 39–117)
ALT: 22 U/L (ref 0–35)
AST: 20 U/L (ref 0–37)
Albumin: 3.8 g/dL (ref 3.5–5.2)
BILIRUBIN TOTAL: 0.4 mg/dL (ref 0.2–1.2)
BUN: 11 mg/dL (ref 6–23)
CO2: 28 mEq/L (ref 19–32)
CREATININE: 0.93 mg/dL (ref 0.50–1.10)
Calcium: 9.1 mg/dL (ref 8.4–10.5)
Chloride: 101 mEq/L (ref 96–112)
GLUCOSE: 126 mg/dL — AB (ref 70–99)
Potassium: 4.2 mEq/L (ref 3.5–5.3)
Sodium: 136 mEq/L (ref 135–145)
Total Protein: 6.7 g/dL (ref 6.0–8.3)

## 2014-04-05 LAB — VITAMIN D 25 HYDROXY (VIT D DEFICIENCY, FRACTURES): Vit D, 25-Hydroxy: 25 ng/mL — ABNORMAL LOW (ref 30–89)

## 2014-04-05 LAB — LACTATE DEHYDROGENASE: LDH: 130 U/L (ref 94–250)

## 2014-04-06 ENCOUNTER — Telehealth: Payer: Self-pay | Admitting: *Deleted

## 2014-04-06 NOTE — Telephone Encounter (Signed)
Message copied by Rico Ala on Fri Apr 06, 2014  4:27 PM ------      Message from: Burney Gauze R      Created: Thu Apr 05, 2014  6:05 PM       Call - Vit D is LOW!!  Need to take Vit D 2000units a day!!!  Laurey Arrow ------

## 2014-05-01 ENCOUNTER — Other Ambulatory Visit: Payer: Self-pay | Admitting: Nurse Practitioner

## 2014-05-01 DIAGNOSIS — E559 Vitamin D deficiency, unspecified: Secondary | ICD-10-CM

## 2014-05-01 DIAGNOSIS — N644 Mastodynia: Secondary | ICD-10-CM

## 2014-05-01 DIAGNOSIS — F411 Generalized anxiety disorder: Secondary | ICD-10-CM

## 2014-05-01 DIAGNOSIS — G8929 Other chronic pain: Secondary | ICD-10-CM

## 2014-05-01 DIAGNOSIS — R079 Chest pain, unspecified: Secondary | ICD-10-CM

## 2014-05-01 MED ORDER — OXYCODONE HCL 15 MG PO TABS: 15.0000 mg | ORAL_TABLET | ORAL | Status: DC | PRN

## 2014-05-01 MED ORDER — ALPRAZOLAM 1 MG PO TABS: 1.0000 mg | ORAL_TABLET | Freq: Two times a day (BID) | ORAL | Status: DC | PRN

## 2014-06-01 ENCOUNTER — Other Ambulatory Visit: Payer: Self-pay | Admitting: *Deleted

## 2014-06-01 DIAGNOSIS — G8929 Other chronic pain: Secondary | ICD-10-CM

## 2014-06-01 DIAGNOSIS — F411 Generalized anxiety disorder: Secondary | ICD-10-CM

## 2014-06-01 DIAGNOSIS — E559 Vitamin D deficiency, unspecified: Secondary | ICD-10-CM

## 2014-06-01 DIAGNOSIS — N644 Mastodynia: Secondary | ICD-10-CM

## 2014-06-01 DIAGNOSIS — R079 Chest pain, unspecified: Secondary | ICD-10-CM

## 2014-06-01 MED ORDER — OXYCODONE HCL 15 MG PO TABS: 15.0000 mg | ORAL_TABLET | ORAL | Status: DC | PRN

## 2014-06-01 MED ORDER — ALPRAZOLAM 1 MG PO TABS: 1.0000 mg | ORAL_TABLET | Freq: Two times a day (BID) | ORAL | Status: DC | PRN

## 2014-07-02 ENCOUNTER — Ambulatory Visit (HOSPITAL_BASED_OUTPATIENT_CLINIC_OR_DEPARTMENT_OTHER): Payer: Self-pay | Admitting: Hematology & Oncology

## 2014-07-02 ENCOUNTER — Other Ambulatory Visit (HOSPITAL_BASED_OUTPATIENT_CLINIC_OR_DEPARTMENT_OTHER): Payer: Self-pay | Admitting: Lab

## 2014-07-02 ENCOUNTER — Encounter: Payer: Self-pay | Admitting: Hematology & Oncology

## 2014-07-02 VITALS — BP 146/80 | HR 76 | Temp 98.3°F | Resp 16 | Ht 63.0 in | Wt 262.0 lb

## 2014-07-02 DIAGNOSIS — F411 Generalized anxiety disorder: Secondary | ICD-10-CM

## 2014-07-02 DIAGNOSIS — E559 Vitamin D deficiency, unspecified: Secondary | ICD-10-CM

## 2014-07-02 DIAGNOSIS — G8929 Other chronic pain: Secondary | ICD-10-CM

## 2014-07-02 DIAGNOSIS — Z853 Personal history of malignant neoplasm of breast: Secondary | ICD-10-CM

## 2014-07-02 DIAGNOSIS — N644 Mastodynia: Secondary | ICD-10-CM

## 2014-07-02 DIAGNOSIS — R079 Chest pain, unspecified: Secondary | ICD-10-CM

## 2014-07-02 DIAGNOSIS — Z720 Tobacco use: Secondary | ICD-10-CM

## 2014-07-02 LAB — CBC WITH DIFFERENTIAL (CANCER CENTER ONLY)
BASO#: 0 10*3/uL (ref 0.0–0.2)
BASO%: 0.4 % (ref 0.0–2.0)
EOS ABS: 0.7 10*3/uL — AB (ref 0.0–0.5)
EOS%: 7.3 % — ABNORMAL HIGH (ref 0.0–7.0)
HEMATOCRIT: 40.7 % (ref 34.8–46.6)
HEMOGLOBIN: 13.7 g/dL (ref 11.6–15.9)
LYMPH#: 2.5 10*3/uL (ref 0.9–3.3)
LYMPH%: 25.9 % (ref 14.0–48.0)
MCH: 32 pg (ref 26.0–34.0)
MCHC: 33.7 g/dL (ref 32.0–36.0)
MCV: 95 fL (ref 81–101)
MONO#: 0.7 10*3/uL (ref 0.1–0.9)
MONO%: 7.8 % (ref 0.0–13.0)
NEUT#: 5.6 10*3/uL (ref 1.5–6.5)
NEUT%: 58.6 % (ref 39.6–80.0)
Platelets: 234 10*3/uL (ref 145–400)
RBC: 4.28 10*6/uL (ref 3.70–5.32)
RDW: 13.6 % (ref 11.1–15.7)
WBC: 9.5 10*3/uL (ref 3.9–10.0)

## 2014-07-02 LAB — CMP (CANCER CENTER ONLY)
ALK PHOS: 75 U/L (ref 26–84)
ALT: 21 U/L (ref 10–47)
AST: 25 U/L (ref 11–38)
Albumin: 3.6 g/dL (ref 3.3–5.5)
BILIRUBIN TOTAL: 0.6 mg/dL (ref 0.20–1.60)
BUN, Bld: 17 mg/dL (ref 7–22)
CO2: 27 meq/L (ref 18–33)
Calcium: 8.9 mg/dL (ref 8.0–10.3)
Chloride: 99 mEq/L (ref 98–108)
Creat: 0.7 mg/dl (ref 0.6–1.2)
Glucose, Bld: 107 mg/dL (ref 73–118)
Potassium: 4.1 mEq/L (ref 3.3–4.7)
SODIUM: 141 meq/L (ref 128–145)
TOTAL PROTEIN: 7.4 g/dL (ref 6.4–8.1)

## 2014-07-02 MED ORDER — ALPRAZOLAM 1 MG PO TABS
1.0000 mg | ORAL_TABLET | Freq: Two times a day (BID) | ORAL | Status: DC | PRN
Start: 2014-07-02 — End: 2014-07-27

## 2014-07-02 MED ORDER — OXYCODONE HCL 15 MG PO TABS: 15.0000 mg | ORAL_TABLET | ORAL | Status: DC | PRN

## 2014-07-02 NOTE — Progress Notes (Signed)
Hematology and Oncology Follow Up Visit  IYARI HAGNER [id number] [birth year] 48 y.o. 07/02/2014   Principle Diagnosis:  1. 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 2. 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 3. Chronic pain syndrome.  Current Therapy:    Observation     Interim History:  Ms.  Brun is back for followup. We last saw her back in September. Since then, she is going through a lot of stress. An old boyfriend wants he back together with her and she wants nothing to do with him. He is doing drugs. Again, she wants nothing to do with him.  Her oldest daughter lost a baby. This is been tough on her. She may move in with her daughter to try to help out.  Pain-wise, she is doing a little bit better.   Her skin looks much better. She has not been back to [facility]. She still has the hernia on the right flank. This does get a little bit worse when she coughs or sneezes.  She's had no fever. She has had no headache. There has been no change in bowel or bladder habits.  She wants to lose weight. I think this be a good idea for her. I told her that she just has to try to exercise a little bit more. Also told her to drink more water and grapefruit juice.  Overall, her performance status is ECOG 1.  Medications: Current outpatient prescriptions: ALPRAZolam (XANAX) 1 MG tablet, Take 1 tablet (1 mg total) by mouth 2 (two) times daily as needed for anxiety., Disp: 60 tablet, Rfl: 0;  amLODipine (NORVASC) 10 MG tablet, Take 1 tablet (10 mg total) by mouth daily before breakfast., Disp: 30 tablet, Rfl: 3;  lansoprazole (PREVACID) 30 MG capsule, Take 30 mg by mouth 2 (two) times daily. , Disp: , Rfl:  lisinopril (PRINIVIL,ZESTRIL) 10 MG tablet, Take 1 tablet (10 mg total) by mouth daily before breakfast., Disp: 30 tablet, Rfl: 3;  oxyCODONE (ROXICODONE) 15 MG immediate release tablet, Take 1 tablet (15 mg total) by mouth every 4 (four) hours as needed., Disp: 90  tablet, Rfl: 0  Allergies:  Allergies  Allergen Reactions  . Penicillins Anaphylaxis and Hives  . Sulfur Anaphylaxis and Hives  . Darvocet [Propoxyphene N-Acetaminophen]     Nausea, hives, and itching    . Doxycycline Nausea And Vomiting  . Morphine And Related Nausea Only  . Naproxen Nausea And Vomiting  . Nsaids Nausea And Vomiting  . Tape Other (See Comments)    Rips skin  . Toradol [Ketorolac Tromethamine] Nausea And Vomiting  . Ultracet [Tramadol-Acetaminophen] Hives, Itching and Nausea And Vomiting  . Ultram [Tramadol Hcl]     Nausea, hives, itching     Past Medical History, Surgical history, Social history, and Family History were reviewed and updated.  Review of Systems: As above  Physical Exam:  height is 5\' 3"  (1.6 m) and weight is 262 lb (118.842 kg). Her oral temperature is 98.3 F (36.8 C). Her blood pressure is 146/80 and her pulse is 76. Her respiration is 16.   Obese white female. Head and neck exam shows no ocular or oral lesions. There are no palpable cervical or supraclavicular lymph nodes. Lungs are clear. Cardiac exam regular rate and rhythm with no murmurs rubs or bruits. Chest wall exam shows bilateral mastectomies. She has minimal tenderness over the chest wall bilaterally. No nodules are noted. No rash is noted. There is no axillary lymph  nodes bilaterally. Abdomen is soft. She is obese. She has a large right lateral abdominal wall hernia. She has laparotomy scar is well-healed. There is no palpable hepatomegaly. There is no spleen tip. Back exam shows no tenderness over the spine. Extremities shows no clubbing cyanosis or edema. His good range motion of her joints. Has good strength. Skin exam shows a very faint chronic macular type rash on her back. Neurological exam is nonfocal.  Lab Results  Component Value Date   WBC 9.5 07/02/2014   HGB 13.7 07/02/2014   HCT 40.7 07/02/2014   MCV 95 07/02/2014   PLT 234 07/02/2014     Chemistry      Component  Value Date/Time   NA 141 07/02/2014 0811   NA 136 04/04/2014 0823   K 4.1 07/02/2014 0811   K 4.2 04/04/2014 0823   CL 99 07/02/2014 0811   CL 101 04/04/2014 0823   CO2 27 07/02/2014 0811   CO2 28 04/04/2014 0823   BUN 17 07/02/2014 0811   BUN 11 04/04/2014 0823   CREATININE 0.7 07/02/2014 0811   CREATININE 0.93 04/04/2014 0823      Component Value Date/Time   CALCIUM 8.9 07/02/2014 0811   CALCIUM 9.1 04/04/2014 0823   ALKPHOS 75 07/02/2014 0811   ALKPHOS 104 04/04/2014 0823   AST 25 07/02/2014 0811   AST 20 04/04/2014 0823   ALT 21 07/02/2014 0811   ALT 22 04/04/2014 0823   BILITOT 0.60 07/02/2014 0811   BILITOT 0.4 04/04/2014 0823         Impression and Plan: Ms. Agramonte is 48 year old white female. She is a past history of localized breast cancer. She's had bilateral mastectomies. She was on hormonal therapy. She really did not take this like she needed to.  thankfully, I have not found any evidence of recurrence.  She is still smoking but trying to cut back.  She is taking aspirin and vitamin D.  I will plan to get her back in 3 months.  I did refill her medications.   [name], MD 12/14/20151:40 PM

## 2014-07-03 ENCOUNTER — Telehealth: Payer: Self-pay | Admitting: Nurse Practitioner

## 2014-07-03 LAB — VITAMIN D 25 HYDROXY (VIT D DEFICIENCY, FRACTURES): VIT D 25 HYDROXY: 15 ng/mL — AB (ref 30–100)

## 2014-07-03 NOTE — Telephone Encounter (Addendum)
-----   Message from [name], MD sent at 07/03/2014  6:46 AM EST ----- Call - her vit d is very low.  She needs to take vit d 2000 units a day.  If she does not do this she is at risk for cancer to come back!!!  [name]  Pt verbalized understanding and appreciation.

## 2014-07-27 ENCOUNTER — Other Ambulatory Visit: Payer: Self-pay | Admitting: Nurse Practitioner

## 2014-07-27 DIAGNOSIS — R079 Chest pain, unspecified: Secondary | ICD-10-CM

## 2014-07-27 DIAGNOSIS — N644 Mastodynia: Secondary | ICD-10-CM

## 2014-07-27 DIAGNOSIS — F411 Generalized anxiety disorder: Secondary | ICD-10-CM

## 2014-07-27 DIAGNOSIS — G8929 Other chronic pain: Secondary | ICD-10-CM

## 2014-07-27 DIAGNOSIS — E559 Vitamin D deficiency, unspecified: Secondary | ICD-10-CM

## 2014-07-27 MED ORDER — ALPRAZOLAM 1 MG PO TABS: 1.0000 mg | ORAL_TABLET | Freq: Two times a day (BID) | ORAL | Status: DC | PRN

## 2014-07-27 MED ORDER — OXYCODONE HCL 15 MG PO TABS: 15.0000 mg | ORAL_TABLET | ORAL | Status: DC | PRN

## 2014-08-24 ENCOUNTER — Other Ambulatory Visit: Payer: Self-pay | Admitting: *Deleted

## 2014-08-24 DIAGNOSIS — N644 Mastodynia: Secondary | ICD-10-CM

## 2014-08-24 DIAGNOSIS — G8929 Other chronic pain: Secondary | ICD-10-CM

## 2014-08-24 DIAGNOSIS — R079 Chest pain, unspecified: Secondary | ICD-10-CM

## 2014-08-24 DIAGNOSIS — E559 Vitamin D deficiency, unspecified: Secondary | ICD-10-CM

## 2014-08-24 DIAGNOSIS — F411 Generalized anxiety disorder: Secondary | ICD-10-CM

## 2014-08-24 MED ORDER — ALPRAZOLAM 1 MG PO TABS: 1.0000 mg | ORAL_TABLET | Freq: Two times a day (BID) | ORAL | Status: DC | PRN

## 2014-08-24 MED ORDER — OXYCODONE HCL 15 MG PO TABS: 15.0000 mg | ORAL_TABLET | ORAL | Status: DC | PRN

## 2014-09-05 ENCOUNTER — Telehealth: Payer: Self-pay | Admitting: Hematology & Oncology

## 2014-09-05 NOTE — Telephone Encounter (Signed)
Pt left message wanting to move 3-14 to 3-7. I left her message 3-7 is at 3:15 and to let me know if that's ok.

## 2014-09-07 ENCOUNTER — Telehealth: Payer: Self-pay | Admitting: Hematology & Oncology

## 2014-09-07 NOTE — Telephone Encounter (Signed)
Pt moved 3-14 to 3-7 is aware of wait from lab to MD

## 2014-09-20 ENCOUNTER — Telehealth: Payer: Self-pay | Admitting: Hematology & Oncology

## 2014-09-20 NOTE — Telephone Encounter (Signed)
Faxed medical records to:  [facility], [place] [address] P: [phone] F: [phone]   07/20/2010 - Present    COPY SCANNED

## 2014-09-24 ENCOUNTER — Other Ambulatory Visit (HOSPITAL_BASED_OUTPATIENT_CLINIC_OR_DEPARTMENT_OTHER): Payer: Self-pay | Admitting: Lab

## 2014-09-24 ENCOUNTER — Encounter: Payer: Self-pay | Admitting: Hematology & Oncology

## 2014-09-24 ENCOUNTER — Ambulatory Visit (HOSPITAL_BASED_OUTPATIENT_CLINIC_OR_DEPARTMENT_OTHER): Payer: Self-pay | Admitting: Hematology & Oncology

## 2014-09-24 ENCOUNTER — Ambulatory Visit (HOSPITAL_BASED_OUTPATIENT_CLINIC_OR_DEPARTMENT_OTHER)
Admission: RE | Admit: 2014-09-24 | Discharge: 2014-09-24 | Disposition: A | Discharge status: Home / Self Care | Payer: Self-pay | Source: Ambulatory Visit | Attending: Hematology & Oncology | Admitting: Hematology & Oncology

## 2014-09-24 VITALS — BP 149/95 | HR 87 | Temp 97.9°F | Resp 16 | Ht 63.0 in | Wt 244.0 lb

## 2014-09-24 DIAGNOSIS — R079 Chest pain, unspecified: Secondary | ICD-10-CM

## 2014-09-24 DIAGNOSIS — R05 Cough: Secondary | ICD-10-CM

## 2014-09-24 DIAGNOSIS — Z7982 Long term (current) use of aspirin: Secondary | ICD-10-CM

## 2014-09-24 DIAGNOSIS — Z853 Personal history of malignant neoplasm of breast: Secondary | ICD-10-CM | POA: Insufficient documentation

## 2014-09-24 DIAGNOSIS — G8929 Other chronic pain: Secondary | ICD-10-CM

## 2014-09-24 DIAGNOSIS — N644 Mastodynia: Secondary | ICD-10-CM

## 2014-09-24 DIAGNOSIS — R0602 Shortness of breath: Secondary | ICD-10-CM | POA: Insufficient documentation

## 2014-09-24 DIAGNOSIS — E559 Vitamin D deficiency, unspecified: Secondary | ICD-10-CM

## 2014-09-24 DIAGNOSIS — K469 Unspecified abdominal hernia without obstruction or gangrene: Secondary | ICD-10-CM

## 2014-09-24 DIAGNOSIS — F411 Generalized anxiety disorder: Secondary | ICD-10-CM

## 2014-09-24 DIAGNOSIS — G4483 Primary cough headache: Secondary | ICD-10-CM

## 2014-09-24 DIAGNOSIS — R0789 Other chest pain: Secondary | ICD-10-CM

## 2014-09-24 DIAGNOSIS — Z9013 Acquired absence of bilateral breasts and nipples: Secondary | ICD-10-CM

## 2014-09-24 LAB — CBC WITH DIFFERENTIAL (CANCER CENTER ONLY)
BASO#: 0 10*3/uL (ref 0.0–0.2)
BASO%: 0.3 % (ref 0.0–2.0)
EOS%: 2.6 % (ref 0.0–7.0)
Eosinophils Absolute: 0.3 10*3/uL (ref 0.0–0.5)
HEMATOCRIT: 42.5 % (ref 34.8–46.6)
HGB: 14.4 g/dL (ref 11.6–15.9)
LYMPH#: 2.2 10*3/uL (ref 0.9–3.3)
LYMPH%: 22.2 % (ref 14.0–48.0)
MCH: 30.9 pg (ref 26.0–34.0)
MCHC: 33.9 g/dL (ref 32.0–36.0)
MCV: 91 fL (ref 81–101)
MONO#: 0.8 10*3/uL (ref 0.1–0.9)
MONO%: 7.7 % (ref 0.0–13.0)
NEUT#: 6.6 10*3/uL — ABNORMAL HIGH (ref 1.5–6.5)
NEUT%: 67.2 % (ref 39.6–80.0)
Platelets: 266 10*3/uL (ref 145–400)
RBC: 4.66 10*6/uL (ref 3.70–5.32)
RDW: 13.9 % (ref 11.1–15.7)
WBC: 9.9 10*3/uL (ref 3.9–10.0)

## 2014-09-24 LAB — COMPREHENSIVE METABOLIC PANEL
ALK PHOS: 80 U/L (ref 39–117)
ALT: 13 U/L (ref 0–35)
AST: 16 U/L (ref 0–37)
Albumin: 3.9 g/dL (ref 3.5–5.2)
BILIRUBIN TOTAL: 0.7 mg/dL (ref 0.2–1.2)
BUN: 13 mg/dL (ref 6–23)
CALCIUM: 9.4 mg/dL (ref 8.4–10.5)
CO2: 25 mEq/L (ref 19–32)
Chloride: 101 mEq/L (ref 96–112)
Creatinine, Ser: 0.66 mg/dL (ref 0.50–1.10)
GLUCOSE: 142 mg/dL — AB (ref 70–99)
Potassium: 3.5 mEq/L (ref 3.5–5.3)
SODIUM: 136 meq/L (ref 135–145)
TOTAL PROTEIN: 7.2 g/dL (ref 6.0–8.3)

## 2014-09-24 MED ORDER — OXYCODONE HCL 15 MG PO TABS: 15.0000 mg | ORAL_TABLET | ORAL | Status: DC | PRN

## 2014-09-24 MED ORDER — HYDROCODONE-HOMATROPINE 5-1.5 MG/5ML PO SYRP: 5.0000 mL | ORAL_SOLUTION | Freq: Four times a day (QID) | ORAL | Status: DC | PRN

## 2014-09-24 MED ORDER — ALPRAZOLAM 1 MG PO TABS: 1.0000 mg | ORAL_TABLET | Freq: Two times a day (BID) | ORAL | Status: DC | PRN

## 2014-09-24 MED ORDER — AZITHROMYCIN 250 MG PO TABS: ORAL_TABLET | ORAL | Status: DC

## 2014-09-24 NOTE — Progress Notes (Signed)
Hematology and Oncology Follow Up Visit  LAKEA MITTELMAN [id number] Oct 14, 1965 49 y.o. 09/24/2014   Principle Diagnosis:  1. 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 2. 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 3. Chronic pain syndrome.  Current Therapy:    Observation     Interim History:  Ms.  Bracco is back for followup. She now lives in [place]. She lives with a friend. She feels much more relaxed now.  Her old boyfriend was arrested for methamphetamine production. This is no surprise to her.  She currently has problems with hernias. She has a large hernia on the right lateral abdominal wall. This happened after she had her partial nephrectomy. This is causing a lot of pain for her.  She's having chest wall pain which is chronic. She's had bilateral mastectomies. She has had radiation in the past.  She has had problems with her skin. Her skin is quite dry. She has stopped smoking. She's losing weight. She just feels much better living in [place] and away from her family.  There's been no problems with nausea or vomiting. She's had no bleeding.  She does have a little cough. She does sound somewhat congested. I will go ahead and do a chest x-ray on her. Given her past history of tobacco use, I think it would be wise to get a chest x-ray to make sure nothing is going on there. I will put her on a Z-Pak and give her some Hycodan cough medicine.  Overall, her performance status is ECOG 1.  Medications:  Current outpatient prescriptions:  .  ALPRAZolam (XANAX) 1 MG tablet, Take 1 tablet (1 mg total) by mouth 2 (two) times daily as needed for anxiety., Disp: 60 tablet, Rfl: 0 .  amLODipine (NORVASC) 10 MG tablet, Take 1 tablet (10 mg total) by mouth daily before breakfast., Disp: 30 tablet, Rfl: 3 .  lansoprazole (PREVACID) 30 MG capsule, Take 30 mg by mouth 2 (two) times daily. , Disp: , Rfl:  .  lisinopril (PRINIVIL,ZESTRIL) 10 MG tablet, Take 1 tablet (10 mg  total) by mouth daily before breakfast., Disp: 30 tablet, Rfl: 3 .  oxyCODONE (ROXICODONE) 15 MG immediate release tablet, Take 1 tablet (15 mg total) by mouth every 4 (four) hours as needed., Disp: 90 tablet, Rfl: 0 .  azithromycin (ZITHROMAX Z-PAK) 250 MG tablet, Take as directed. Take 2 pills today, then 1 pill daily for 4 days, Disp: 6 each, Rfl: 0 .  HYDROcodone-homatropine (HYCODAN) 5-1.5 MG/5ML syrup, Take 5 mLs by mouth every 6 (six) hours as needed for cough., Disp: 120 mL, Rfl: 0  Allergies:  Allergies  Allergen Reactions  . Penicillins Anaphylaxis and Hives  . Sulfur Anaphylaxis and Hives  . Darvocet [Propoxyphene N-Acetaminophen]     Nausea, hives, and itching    . Doxycycline Nausea And Vomiting  . Morphine And Related Nausea Only  . Naproxen Nausea And Vomiting  . Nsaids Nausea And Vomiting  . Tape Other (See Comments)    Rips skin  . Toradol [Ketorolac Tromethamine] Nausea And Vomiting  . Ultracet [Tramadol-Acetaminophen] Hives, Itching and Nausea And Vomiting  . Ultram [Tramadol Hcl]     Nausea, hives, itching     Past Medical History, Surgical history, Social history, and Family History were reviewed and updated.  Review of Systems: As above  Physical Exam:  height is 5\' 3"  (1.6 m) and weight is 244 lb (110.678 kg). Her oral temperature is 97.9 F (36.6 C). Her  blood pressure is 149/95 and her pulse is 87. Her respiration is 16.   Obese white female. Head and neck exam shows no ocular or oral lesions. There are no palpable cervical or supraclavicular lymph nodes. Lungs are with some scattered rhonchi bilaterally.. Cardiac exam regular rate and rhythm with no murmurs rubs or bruits. Chest wall exam shows bilateral mastectomies. She has minimal tenderness over the chest wall bilaterally. No nodules are noted. No rash is noted. There is no axillary lymph nodes bilaterally. Abdomen is soft. She is obese. She has a large right lateral abdominal wall hernia. She has  laparotomy scar is well-healed. There is no palpable hepatomegaly. There is no spleen tip. Back exam shows no tenderness over the spine. Extremities shows no clubbing cyanosis or edema. His good range motion of her joints. Has good strength. Skin exam shows a very faint chronic macular type rash on her back. Neurological exam is nonfocal.  Lab Results  Component Value Date   WBC 9.9 09/24/2014   HGB 14.4 09/24/2014   HCT 42.5 09/24/2014   MCV 91 09/24/2014   PLT 266 09/24/2014     Chemistry      Component Value Date/Time   NA 141 07/02/2014 0811   NA 136 04/04/2014 0823   K 4.1 07/02/2014 0811   K 4.2 04/04/2014 0823   CL 99 07/02/2014 0811   CL 101 04/04/2014 0823   CO2 27 07/02/2014 0811   CO2 28 04/04/2014 0823   BUN 17 07/02/2014 0811   BUN 11 04/04/2014 0823   CREATININE 0.7 07/02/2014 0811   CREATININE 0.93 04/04/2014 0823      Component Value Date/Time   CALCIUM 8.9 07/02/2014 0811   CALCIUM 9.1 04/04/2014 0823   ALKPHOS 75 07/02/2014 0811   ALKPHOS 104 04/04/2014 0823   AST 25 07/02/2014 0811   AST 20 04/04/2014 0823   ALT 21 07/02/2014 0811   ALT 22 04/04/2014 0823   BILITOT 0.60 07/02/2014 0811   BILITOT 0.4 04/04/2014 0823         Impression and Plan: Ms. Tineo is 49 year old white female. She is a past history of localized breast cancer. She had a Oncotype score of 9. We had her on Fareston but she really think this sporadically.  She now is out from her left mastectomy by 7 years.  She is taking aspirin and vitamin D. I told her that it was so important for her to take the vitamin D.  We will see about making a referral back to the urology clinic at [facility] so that she can see about getting the hernias repaired.  It is very hard or bursa and possible for to work because she really cannot pick up anything and she cannot sit. She cannot bend. She just is overwhelmed by the pain that is being caused by this hernia and also by the chronic chest  wall pain that she has from the mastectomies.  I will plan to get her back in 4 months.  I did refill her medications.   [name], MD 3/7/20165:19 PM

## 2014-09-25 ENCOUNTER — Telehealth: Payer: Self-pay | Admitting: *Deleted

## 2014-09-25 NOTE — Telephone Encounter (Signed)
-----   Message from [name], MD sent at 09/25/2014 12:12 PM EST ----- Call - no CXR problems.  No pneumonia.

## 2014-09-28 ENCOUNTER — Telehealth: Payer: Self-pay | Admitting: Hematology & Oncology

## 2014-09-28 NOTE — Telephone Encounter (Signed)
Faxed medical records to:  [facility], [address] [place] [address] P: [phone] F: [phone]   07/03/2014 - Present    COPY SCANNED

## 2014-10-01 ENCOUNTER — Ambulatory Visit: Payer: Self-pay | Admitting: Hematology & Oncology

## 2014-10-01 ENCOUNTER — Other Ambulatory Visit: Payer: Self-pay | Admitting: Lab

## 2014-10-02 ENCOUNTER — Encounter: Payer: Self-pay | Admitting: Nurse Practitioner

## 2014-10-02 NOTE — Progress Notes (Signed)
Faxed required information to [facility] attn: Hernie @ [phone]. Confirmation received.

## 2014-10-04 ENCOUNTER — Telehealth: Payer: Self-pay | Admitting: Hematology & Oncology

## 2014-10-04 NOTE — Telephone Encounter (Signed)
Faxed medical records to:  [facility] [address] [place], [place] [address]  P: [phone] Merleen Nicely: [phone]    COPY SCANNED    COPY SCANNED

## 2014-10-18 ENCOUNTER — Other Ambulatory Visit: Payer: Self-pay | Admitting: Nurse Practitioner

## 2014-10-18 DIAGNOSIS — G4483 Primary cough headache: Secondary | ICD-10-CM

## 2014-10-18 DIAGNOSIS — N644 Mastodynia: Secondary | ICD-10-CM

## 2014-10-18 DIAGNOSIS — R079 Chest pain, unspecified: Secondary | ICD-10-CM

## 2014-10-18 DIAGNOSIS — F411 Generalized anxiety disorder: Secondary | ICD-10-CM

## 2014-10-18 DIAGNOSIS — E559 Vitamin D deficiency, unspecified: Secondary | ICD-10-CM

## 2014-10-18 DIAGNOSIS — G8929 Other chronic pain: Secondary | ICD-10-CM

## 2014-10-18 MED ORDER — ALPRAZOLAM 1 MG PO TABS: 1.0000 mg | ORAL_TABLET | Freq: Two times a day (BID) | ORAL | Status: DC | PRN

## 2014-10-18 MED ORDER — OXYCODONE HCL 15 MG PO TABS: 15.0000 mg | ORAL_TABLET | ORAL | Status: DC | PRN

## 2014-10-20 ENCOUNTER — Other Ambulatory Visit: Payer: Self-pay | Admitting: Oncology

## 2014-10-29 DIAGNOSIS — K458 Other specified abdominal hernia without obstruction or gangrene: Secondary | ICD-10-CM | POA: Insufficient documentation

## 2014-11-19 ENCOUNTER — Other Ambulatory Visit: Payer: Self-pay | Admitting: *Deleted

## 2014-11-19 DIAGNOSIS — E559 Vitamin D deficiency, unspecified: Secondary | ICD-10-CM

## 2014-11-19 DIAGNOSIS — R079 Chest pain, unspecified: Secondary | ICD-10-CM

## 2014-11-19 DIAGNOSIS — N644 Mastodynia: Secondary | ICD-10-CM

## 2014-11-19 DIAGNOSIS — G4483 Primary cough headache: Secondary | ICD-10-CM

## 2014-11-19 DIAGNOSIS — G8929 Other chronic pain: Secondary | ICD-10-CM

## 2014-11-19 DIAGNOSIS — F411 Generalized anxiety disorder: Secondary | ICD-10-CM

## 2014-11-19 MED ORDER — OXYCODONE HCL 15 MG PO TABS: 15.0000 mg | ORAL_TABLET | ORAL | Status: DC | PRN

## 2014-11-19 MED ORDER — ALPRAZOLAM 1 MG PO TABS: 1.0000 mg | ORAL_TABLET | Freq: Two times a day (BID) | ORAL | Status: DC | PRN

## 2014-11-26 ENCOUNTER — Telehealth: Payer: Self-pay | Admitting: Hematology & Oncology

## 2014-11-26 NOTE — Telephone Encounter (Signed)
Pt left message Saturday wants to move 6-6 to 6-3 because she has another appointment that day. I tried to call her back no answer and mail box is full.

## 2014-12-21 ENCOUNTER — Other Ambulatory Visit: Payer: Self-pay

## 2014-12-21 DIAGNOSIS — C50919 Malignant neoplasm of unspecified site of unspecified female breast: Secondary | ICD-10-CM

## 2014-12-24 ENCOUNTER — Other Ambulatory Visit (HOSPITAL_BASED_OUTPATIENT_CLINIC_OR_DEPARTMENT_OTHER): Payer: Self-pay

## 2014-12-24 ENCOUNTER — Encounter: Payer: Self-pay | Admitting: Hematology & Oncology

## 2014-12-24 ENCOUNTER — Ambulatory Visit (HOSPITAL_BASED_OUTPATIENT_CLINIC_OR_DEPARTMENT_OTHER): Payer: Self-pay | Admitting: Hematology & Oncology

## 2014-12-24 ENCOUNTER — Telehealth: Payer: Self-pay | Admitting: Hematology & Oncology

## 2014-12-24 VITALS — BP 145/77 | HR 78 | Temp 98.6°F | Resp 18 | Ht 63.0 in | Wt 243.0 lb

## 2014-12-24 DIAGNOSIS — G8929 Other chronic pain: Secondary | ICD-10-CM

## 2014-12-24 DIAGNOSIS — J40 Bronchitis, not specified as acute or chronic: Secondary | ICD-10-CM

## 2014-12-24 DIAGNOSIS — R079 Chest pain, unspecified: Secondary | ICD-10-CM

## 2014-12-24 DIAGNOSIS — Z853 Personal history of malignant neoplasm of breast: Secondary | ICD-10-CM

## 2014-12-24 DIAGNOSIS — N644 Mastodynia: Secondary | ICD-10-CM

## 2014-12-24 DIAGNOSIS — E559 Vitamin D deficiency, unspecified: Secondary | ICD-10-CM

## 2014-12-24 DIAGNOSIS — C50919 Malignant neoplasm of unspecified site of unspecified female breast: Secondary | ICD-10-CM

## 2014-12-24 DIAGNOSIS — F411 Generalized anxiety disorder: Secondary | ICD-10-CM

## 2014-12-24 DIAGNOSIS — G4483 Primary cough headache: Secondary | ICD-10-CM

## 2014-12-24 LAB — CBC WITH DIFFERENTIAL (CANCER CENTER ONLY)
BASO#: 0 10*3/uL (ref 0.0–0.2)
BASO%: 0.3 % (ref 0.0–2.0)
EOS%: 4.8 % (ref 0.0–7.0)
Eosinophils Absolute: 0.4 10*3/uL (ref 0.0–0.5)
HCT: 37.8 % (ref 34.8–46.6)
HEMOGLOBIN: 13 g/dL (ref 11.6–15.9)
LYMPH#: 2 10*3/uL (ref 0.9–3.3)
LYMPH%: 25.4 % (ref 14.0–48.0)
MCH: 32 pg (ref 26.0–34.0)
MCHC: 34.4 g/dL (ref 32.0–36.0)
MCV: 93 fL (ref 81–101)
MONO#: 0.5 10*3/uL (ref 0.1–0.9)
MONO%: 6.8 % (ref 0.0–13.0)
NEUT#: 5 10*3/uL (ref 1.5–6.5)
NEUT%: 62.7 % (ref 39.6–80.0)
Platelets: 196 10*3/uL (ref 145–400)
RBC: 4.06 10*6/uL (ref 3.70–5.32)
RDW: 13.6 % (ref 11.1–15.7)
WBC: 7.9 10*3/uL (ref 3.9–10.0)

## 2014-12-24 LAB — COMPREHENSIVE METABOLIC PANEL
ALT: 18 U/L (ref 0–35)
AST: 26 U/L (ref 0–37)
Albumin: 3.5 g/dL (ref 3.5–5.2)
Alkaline Phosphatase: 76 U/L (ref 39–117)
BUN: 5 mg/dL — ABNORMAL LOW (ref 6–23)
CO2: 28 meq/L (ref 19–32)
Calcium: 9.2 mg/dL (ref 8.4–10.5)
Chloride: 102 mEq/L (ref 96–112)
Creatinine, Ser: 0.56 mg/dL (ref 0.50–1.10)
Glucose, Bld: 120 mg/dL — ABNORMAL HIGH (ref 70–99)
POTASSIUM: 3.8 meq/L (ref 3.5–5.3)
SODIUM: 138 meq/L (ref 135–145)
TOTAL PROTEIN: 6.1 g/dL (ref 6.0–8.3)
Total Bilirubin: 0.6 mg/dL (ref 0.2–1.2)

## 2014-12-24 MED ORDER — OXYCODONE HCL 15 MG PO TABS: 15.0000 mg | ORAL_TABLET | ORAL | Status: DC | PRN

## 2014-12-24 MED ORDER — HYDROCODONE-HOMATROPINE 5-1.5 MG/5ML PO SYRP
5.0000 mL | ORAL_SOLUTION | Freq: Four times a day (QID) | ORAL | Status: DC | PRN
Start: 2014-12-24 — End: 2015-07-12

## 2014-12-24 MED ORDER — MOXIFLOXACIN HCL 400 MG PO TABS: 400.0000 mg | ORAL_TABLET | Freq: Every day | ORAL | Status: DC

## 2014-12-24 MED ORDER — ALPRAZOLAM 1 MG PO TABS: 1.0000 mg | ORAL_TABLET | Freq: Two times a day (BID) | ORAL | Status: DC | PRN

## 2014-12-24 NOTE — Progress Notes (Signed)
Hematology and Oncology Follow Up Visit  [name] [id number] Dec 30, 1965 49 y.o. 12/24/2014   Principle Diagnosis:  1. 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 2. 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 3. Chronic pain syndrome.  Current Therapy:    Observation     Interim History:  [name] is back for followup. She now lives in [place]. She lives with a friend. She feels much more relaxed now.  She comes in with her daughter.  She will be having surgery at [facility] for a hernia repair. This will be in August.  She's cutting back on smoking. She still has rhonchi to's. We will give her some Avelox for this. I'm not sure if she has a regular family doctor or not.  She's not having any issues with bowels or bladder. Has been no leg swelling. She does have a rash on her legs which is chronic.  She has the bilateral mastectomies. She is a bit irritated with a fold of skin on the lateral portion of the right mastectomy scar.  Her pain control seems to be doing quite well.  I think the fact that she is now away from the ex-boyfriend and all of his issues is certainly making life better for her.  Overall, her performance status is ECOG 1.  Medications:  Current outpatient prescriptions:  .  ALPRAZolam (XANAX) 1 MG tablet, Take 1 tablet (1 mg total) by mouth 2 (two) times daily as needed for anxiety., Disp: 60 tablet, Rfl: 0 .  amLODipine (NORVASC) 10 MG tablet, Take 1 tablet (10 mg total) by mouth daily before breakfast., Disp: 30 tablet, Rfl: 3 .  azithromycin (ZITHROMAX Z-PAK) 250 MG tablet, Take as directed. Take 2 pills today, then 1 pill daily for 4 days, Disp: 6 each, Rfl: 0 .  HYDROcodone-homatropine (HYCODAN) 5-1.5 MG/5ML syrup, Take 5 mLs by mouth every 6 (six) hours as needed for cough., Disp: 120 mL, Rfl: 0 .  lansoprazole (PREVACID) 30 MG capsule, Take 30 mg by mouth 2 (two) times daily. , Disp: , Rfl:  .  lisinopril (PRINIVIL,ZESTRIL) 10 MG  tablet, Take 1 tablet (10 mg total) by mouth daily before breakfast., Disp: 30 tablet, Rfl: 3 .  moxifloxacin (AVELOX) 400 MG tablet, Take 1 tablet (400 mg total) by mouth daily at 8 pm., Disp: 7 tablet, Rfl: 0 .  oxyCODONE (ROXICODONE) 15 MG immediate release tablet, Take 1 tablet (15 mg total) by mouth every 4 (four) hours as needed., Disp: 90 tablet, Rfl: 0  Allergies:  Allergies  Allergen Reactions  . Penicillins Anaphylaxis and Hives  . Sulfur Anaphylaxis and Hives  . Darvocet [Propoxyphene N-Acetaminophen]     Nausea, hives, and itching    . Doxycycline Nausea And Vomiting  . Morphine And Related Nausea Only  . Naproxen Nausea And Vomiting  . Nsaids Nausea And Vomiting  . Tape Other (See Comments)    Rips skin  . Toradol [Ketorolac Tromethamine] Nausea And Vomiting  . Ultracet [Tramadol-Acetaminophen] Hives, Itching and Nausea And Vomiting  . Ultram [Tramadol Hcl]     Nausea, hives, itching     Past Medical History, Surgical history, Social history, and Family History were reviewed and updated.  Review of Systems: As above  Physical Exam:  height is 5\' 3"  (1.6 m) and weight is 243 lb (110.224 kg). Her oral temperature is 98.6 F (37 C). Her blood pressure is 145/77 and her pulse is 78. Her respiration is 18.  Obese white female. Head and neck exam shows no ocular or oral lesions. There are no palpable cervical or supraclavicular lymph nodes. Lungs are with some scattered rhonchi bilaterally.. Cardiac exam regular rate and rhythm with no murmurs rubs or bruits. Chest wall exam shows bilateral mastectomies. She has minimal tenderness over the chest wall bilaterally. No nodules are noted. No rash is noted. There is no axillary lymph nodes bilaterally. Abdomen is soft. She is obese. She has a large right lateral abdominal wall hernia. She has laparotomy scar is well-healed. There is no palpable hepatomegaly. There is no spleen tip. Back exam shows no tenderness over the spine.  Extremities shows no clubbing cyanosis or edema. His good range motion of her joints. Has good strength. Skin exam shows a very faint chronic macular type rash on her back. Neurological exam is nonfocal.  Lab Results  Component Value Date   WBC 7.9 12/24/2014   HGB 13.0 12/24/2014   HCT 37.8 12/24/2014   MCV 93 12/24/2014   PLT 196 12/24/2014     Chemistry      Component Value Date/Time   NA 136 09/24/2014 1504   NA 141 07/02/2014 0811   K 3.5 09/24/2014 1504   K 4.1 07/02/2014 0811   CL 101 09/24/2014 1504   CL 99 07/02/2014 0811   CO2 25 09/24/2014 1504   CO2 27 07/02/2014 0811   BUN 13 09/24/2014 1504   BUN 17 07/02/2014 0811   CREATININE 0.66 09/24/2014 1504   CREATININE 0.7 07/02/2014 0811      Component Value Date/Time   CALCIUM 9.4 09/24/2014 1504   CALCIUM 8.9 07/02/2014 0811   ALKPHOS 80 09/24/2014 1504   ALKPHOS 75 07/02/2014 0811   AST 16 09/24/2014 1504   AST 25 07/02/2014 0811   ALT 13 09/24/2014 1504   ALT 21 07/02/2014 0811   BILITOT 0.7 09/24/2014 1504   BILITOT 0.60 07/02/2014 0811         Impression and Plan: [name] is 49 year old white female. She is a past history of localized breast cancer. She had a Oncotype score of 9. We had her on Fareston but she really took this sporadically.  She now is out from her left mastectomy by 7 years.  She is taking aspirin and vitamin D. I told her that it was so important for her to take the vitamin D.  I'm glad that she will have the surgery at [facility] for this hernia. Hopefully this will help with some of the issues that she has.  It is also a good idea that she is in a different state now. This way, the ex boyfriend will not be able to find her.  I will plan to see her back in 3 more months.  I did refill her medications.   [name], MD 6/6/20163:49 PM

## 2014-12-24 NOTE — Telephone Encounter (Signed)
Pt has NO INSURANCE. However, advised her MEDICARE and MEDICAID case is pending appeal status at this time.

## 2014-12-25 LAB — LACTATE DEHYDROGENASE: LDH: 210 U/L (ref 94–250)

## 2014-12-25 LAB — VITAMIN D 25 HYDROXY (VIT D DEFICIENCY, FRACTURES): VIT D 25 HYDROXY: 16 ng/mL — AB (ref 30–100)

## 2015-01-15 ENCOUNTER — Other Ambulatory Visit: Payer: Self-pay | Admitting: *Deleted

## 2015-01-15 DIAGNOSIS — G8929 Other chronic pain: Secondary | ICD-10-CM

## 2015-01-15 DIAGNOSIS — J40 Bronchitis, not specified as acute or chronic: Secondary | ICD-10-CM

## 2015-01-15 DIAGNOSIS — E559 Vitamin D deficiency, unspecified: Secondary | ICD-10-CM

## 2015-01-15 DIAGNOSIS — R079 Chest pain, unspecified: Secondary | ICD-10-CM

## 2015-01-15 DIAGNOSIS — N644 Mastodynia: Secondary | ICD-10-CM

## 2015-01-15 DIAGNOSIS — F411 Generalized anxiety disorder: Secondary | ICD-10-CM

## 2015-01-15 DIAGNOSIS — G4483 Primary cough headache: Secondary | ICD-10-CM

## 2015-01-15 MED ORDER — ALPRAZOLAM 1 MG PO TABS: 1.0000 mg | ORAL_TABLET | Freq: Two times a day (BID) | ORAL | Status: DC | PRN

## 2015-01-15 MED ORDER — OXYCODONE HCL 15 MG PO TABS: 15.0000 mg | ORAL_TABLET | ORAL | Status: DC | PRN

## 2015-02-19 ENCOUNTER — Other Ambulatory Visit: Payer: Self-pay | Admitting: *Deleted

## 2015-02-19 DIAGNOSIS — G8929 Other chronic pain: Secondary | ICD-10-CM

## 2015-02-19 DIAGNOSIS — G4483 Primary cough headache: Secondary | ICD-10-CM

## 2015-02-19 DIAGNOSIS — F411 Generalized anxiety disorder: Secondary | ICD-10-CM

## 2015-02-19 DIAGNOSIS — N644 Mastodynia: Secondary | ICD-10-CM

## 2015-02-19 DIAGNOSIS — R079 Chest pain, unspecified: Secondary | ICD-10-CM

## 2015-02-19 DIAGNOSIS — J40 Bronchitis, not specified as acute or chronic: Secondary | ICD-10-CM

## 2015-02-19 DIAGNOSIS — E559 Vitamin D deficiency, unspecified: Secondary | ICD-10-CM

## 2015-02-20 ENCOUNTER — Other Ambulatory Visit: Payer: Self-pay | Admitting: *Deleted

## 2015-02-20 DIAGNOSIS — F411 Generalized anxiety disorder: Secondary | ICD-10-CM

## 2015-02-20 DIAGNOSIS — E559 Vitamin D deficiency, unspecified: Secondary | ICD-10-CM

## 2015-02-20 DIAGNOSIS — N644 Mastodynia: Secondary | ICD-10-CM

## 2015-02-20 DIAGNOSIS — R079 Chest pain, unspecified: Secondary | ICD-10-CM

## 2015-02-20 DIAGNOSIS — G4483 Primary cough headache: Secondary | ICD-10-CM

## 2015-02-20 DIAGNOSIS — G8929 Other chronic pain: Secondary | ICD-10-CM

## 2015-02-20 DIAGNOSIS — J40 Bronchitis, not specified as acute or chronic: Secondary | ICD-10-CM

## 2015-02-20 MED ORDER — ALPRAZOLAM 1 MG PO TABS
1.0000 mg | ORAL_TABLET | Freq: Two times a day (BID) | ORAL | Status: DC | PRN
Start: 2015-02-20 — End: 2015-02-20

## 2015-02-20 MED ORDER — OXYCODONE HCL 15 MG PO TABS: 15.0000 mg | ORAL_TABLET | ORAL | Status: DC | PRN

## 2015-02-20 MED ORDER — ALPRAZOLAM 1 MG PO TABS: 1.0000 mg | ORAL_TABLET | Freq: Two times a day (BID) | ORAL | Status: DC | PRN

## 2015-03-22 ENCOUNTER — Other Ambulatory Visit (HOSPITAL_BASED_OUTPATIENT_CLINIC_OR_DEPARTMENT_OTHER): Payer: Self-pay

## 2015-03-22 ENCOUNTER — Encounter: Payer: Self-pay | Admitting: Family

## 2015-03-22 ENCOUNTER — Ambulatory Visit (HOSPITAL_BASED_OUTPATIENT_CLINIC_OR_DEPARTMENT_OTHER): Payer: Self-pay | Admitting: Family

## 2015-03-22 DIAGNOSIS — Z85528 Personal history of other malignant neoplasm of kidney: Secondary | ICD-10-CM

## 2015-03-22 DIAGNOSIS — N644 Mastodynia: Secondary | ICD-10-CM

## 2015-03-22 DIAGNOSIS — G8929 Other chronic pain: Secondary | ICD-10-CM

## 2015-03-22 DIAGNOSIS — C649 Malignant neoplasm of unspecified kidney, except renal pelvis: Secondary | ICD-10-CM

## 2015-03-22 DIAGNOSIS — G4483 Primary cough headache: Secondary | ICD-10-CM

## 2015-03-22 DIAGNOSIS — Z853 Personal history of malignant neoplasm of breast: Secondary | ICD-10-CM

## 2015-03-22 DIAGNOSIS — F411 Generalized anxiety disorder: Secondary | ICD-10-CM

## 2015-03-22 DIAGNOSIS — R079 Chest pain, unspecified: Secondary | ICD-10-CM

## 2015-03-22 DIAGNOSIS — E559 Vitamin D deficiency, unspecified: Secondary | ICD-10-CM

## 2015-03-22 DIAGNOSIS — J40 Bronchitis, not specified as acute or chronic: Secondary | ICD-10-CM

## 2015-03-22 DIAGNOSIS — C50919 Malignant neoplasm of unspecified site of unspecified female breast: Secondary | ICD-10-CM

## 2015-03-22 LAB — CMP (CANCER CENTER ONLY)
ALT(SGPT): 25 U/L (ref 10–47)
AST: 23 U/L (ref 11–38)
Albumin: 4 g/dL (ref 3.3–5.5)
Alkaline Phosphatase: 94 U/L — ABNORMAL HIGH (ref 26–84)
BUN, Bld: 5 mg/dL — ABNORMAL LOW (ref 7–22)
CHLORIDE: 102 meq/L (ref 98–108)
CO2: 28 meq/L (ref 18–33)
CREATININE: 0.5 mg/dL — AB (ref 0.6–1.2)
Calcium: 9.8 mg/dL (ref 8.0–10.3)
GLUCOSE: 113 mg/dL (ref 73–118)
Potassium: 3.5 mEq/L (ref 3.3–4.7)
Sodium: 142 mEq/L (ref 128–145)
Total Bilirubin: 0.7 mg/dl (ref 0.20–1.60)
Total Protein: 7.6 g/dL (ref 6.4–8.1)

## 2015-03-22 LAB — CBC WITH DIFFERENTIAL (CANCER CENTER ONLY)
BASO#: 0 10*3/uL (ref 0.0–0.2)
BASO%: 0.2 % (ref 0.0–2.0)
EOS ABS: 0.2 10*3/uL (ref 0.0–0.5)
EOS%: 2.6 % (ref 0.0–7.0)
HCT: 41.6 % (ref 34.8–46.6)
HGB: 14.3 g/dL (ref 11.6–15.9)
LYMPH#: 2.1 10*3/uL (ref 0.9–3.3)
LYMPH%: 22.5 % (ref 14.0–48.0)
MCH: 31.1 pg (ref 26.0–34.0)
MCHC: 34.4 g/dL (ref 32.0–36.0)
MCV: 90 fL (ref 81–101)
MONO#: 0.5 10*3/uL (ref 0.1–0.9)
MONO%: 5.5 % (ref 0.0–13.0)
NEUT#: 6.5 10*3/uL (ref 1.5–6.5)
NEUT%: 69.2 % (ref 39.6–80.0)
Platelets: 237 10*3/uL (ref 145–400)
RBC: 4.6 10*6/uL (ref 3.70–5.32)
RDW: 12.9 % (ref 11.1–15.7)
WBC: 9.3 10*3/uL (ref 3.9–10.0)

## 2015-03-22 MED ORDER — ALPRAZOLAM 1 MG PO TABS: 1.0000 mg | ORAL_TABLET | Freq: Two times a day (BID) | ORAL | Status: DC | PRN

## 2015-03-22 MED ORDER — OXYCODONE HCL 15 MG PO TABS: 15.0000 mg | ORAL_TABLET | ORAL | Status: DC | PRN

## 2015-03-22 NOTE — Progress Notes (Signed)
Hematology and Oncology Follow Up Visit  STELA IWASAKI [id number] [birth year] 49 y.o. 03/22/2015   Principle Diagnosis:  1. 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 Catalina is here today with her daughter for a follow-up. She is feeling fatigued at times. She has been having sinus congestion and has an earache today.  She has had no fever, chills, n/v, rash, dizziness, chest pain, palpitations, abdominal pain, changes in bowel or bladder habits. She has not noticed any blood in her urine or stool.  No lymphadenopathy found on exam. No changes with her chest.  She does have COPD and has SOB with exertion at times. She also keeps a dry cough. She is still smoking but has cut back. She also uses a vape cigarette at times.  She has had no swelling, tenderness, numbness or tingling in her extremities. Her pain is well controlled at this time.  She stays with her daughter part time. She is eating well and staying hydrated. Her weight is stable. She has been walking and watching her diet to try and lose weight. She was excited to learn she was down 18 lbs today.  Medications:    Medication List       This list is accurate as of: 03/22/15 10:48 AM.  Always use your most recent med list.               ALPRAZolam 1 MG tablet  Commonly known as:  XANAX  Take 1 tablet (1 mg total) by mouth 2 (two) times daily as needed for anxiety.     amLODipine 10 MG tablet  Commonly known as:  NORVASC  Take 1 tablet (10 mg total) by mouth daily before breakfast.     HYDROcodone-homatropine 5-1.5 MG/5ML syrup  Commonly known as:  HYCODAN  Take 5 mLs by mouth every 6 (six) hours as needed for cough.     lisinopril 10 MG tablet  Commonly known as:  PRINIVIL,ZESTRIL  Take 1 tablet (10 mg total) by mouth daily before breakfast.     oxyCODONE 15 MG immediate release tablet  Commonly known  as:  ROXICODONE  Take 1 tablet (15 mg total) by mouth every 4 (four) hours as needed.     PREVACID 30 MG capsule  Generic drug:  lansoprazole  Take 30 mg by mouth 2 (two) times daily.        Allergies:  Allergies  Allergen Reactions  . Penicillins Anaphylaxis and Hives  . Sulfur Anaphylaxis and Hives  . Darvocet [Propoxyphene N-Acetaminophen]     Nausea, hives, and itching    . Doxycycline Nausea And Vomiting  . Morphine And Related Nausea Only  . Naproxen Nausea And Vomiting  . Nsaids Nausea And Vomiting  . Tape Other (See Comments)    Rips skin  . Toradol [Ketorolac Tromethamine] Nausea And Vomiting  . Ultracet [Tramadol-Acetaminophen] Hives, Itching and Nausea And Vomiting  . Ultram [Tramadol Hcl]     Nausea, hives, itching     Past Medical History, Surgical history, Social history, and Family History were reviewed and updated.  Review of Systems: All other 10 point review of systems is negative.   Physical Exam:  height is 5\' 3"  (1.6 m) and weight is 225 lb (102.059 kg). Her oral temperature is 98.1 F (36.7 C). Her blood pressure is 144/76 and her pulse is 97. Her  respiration is 18.   Wt Readings from Last 3 Encounters:  03/22/15 225 lb (102.059 kg)  12/24/14 243 lb (110.224 kg)  09/24/14 244 lb (110.678 kg)    Ocular: Sclerae unicteric, pupils equal, round and reactive to light Ear-nose-throat: Oropharynx clear, dentition fair Lymphatic: No cervical or supraclavicular adenopathy Lungs no rales or rhonchi, good excursion bilaterally Heart regular rate and rhythm, no murmur appreciated Abd soft, nontender, positive bowel sounds MSK no focal spinal tenderness, no joint edema Neuro: non-focal, well-oriented, appropriate affect Breasts: No changes with her chest. She had bilateral mastectomy. No mass, lesion, rash or lymphadenopathy.   Lab Results  Component Value Date   WBC 9.3 03/22/2015   HGB 14.3 03/22/2015   HCT 41.6 03/22/2015   MCV 90 03/22/2015    PLT 237 03/22/2015   No results found for: FERRITIN, IRON, TIBC, UIBC, IRONPCTSAT Lab Results  Component Value Date   RBC 4.60 03/22/2015   No results found for: KPAFRELGTCHN, LAMBDASER, KAPLAMBRATIO No results found for: Kandis Cocking, IGMSERUM No results found for: Odetta Pink, SPEI   Chemistry      Component Value Date/Time   NA 142 03/22/2015 0938   NA 138 12/24/2014 1429   K 3.5 03/22/2015 0938   K 3.8 12/24/2014 1429   CL 102 03/22/2015 0938   CL 102 12/24/2014 1429   CO2 28 03/22/2015 0938   CO2 28 12/24/2014 1429   BUN 5* 03/22/2015 0938   BUN 5* 12/24/2014 1429   CREATININE 0.5* 03/22/2015 0938   CREATININE 0.56 12/24/2014 1429      Component Value Date/Time   CALCIUM 9.8 03/22/2015 0938   CALCIUM 9.2 12/24/2014 1429   ALKPHOS 94* 03/22/2015 0938   ALKPHOS 76 12/24/2014 1429   AST 23 03/22/2015 0938   AST 26 12/24/2014 1429   ALT 25 03/22/2015 0938   ALT 18 12/24/2014 1429   BILITOT 0.70 03/22/2015 0938   BILITOT 0.6 12/24/2014 1429     Impression and Plan: Ms. Seybold is 49 yo white female she is a past history of localized right breast cancer. She had bilateral mastectomies 7 years ago. She is doing well and her pain is now well controlled. There has been no evidence of recurrence. She is asymptomatic at this time.  She takes vitamin D daily.  We did refill her medications today.  We will plan to see her back in 3 months for labs and follow-up. She knows to contact us with any questions or concerns. We can certainly see her sooner if need be.   Eliezer Bottom, NP 9/2/201610:48 AM

## 2015-03-23 LAB — VITAMIN D 25 HYDROXY (VIT D DEFICIENCY, FRACTURES): Vit D, 25-Hydroxy: 20 ng/mL — ABNORMAL LOW (ref 30–100)

## 2015-04-17 ENCOUNTER — Other Ambulatory Visit: Payer: Self-pay | Admitting: *Deleted

## 2015-04-17 DIAGNOSIS — F411 Generalized anxiety disorder: Secondary | ICD-10-CM

## 2015-04-17 DIAGNOSIS — G4483 Primary cough headache: Secondary | ICD-10-CM

## 2015-04-17 DIAGNOSIS — N644 Mastodynia: Secondary | ICD-10-CM

## 2015-04-17 DIAGNOSIS — J40 Bronchitis, not specified as acute or chronic: Secondary | ICD-10-CM

## 2015-04-17 DIAGNOSIS — E559 Vitamin D deficiency, unspecified: Secondary | ICD-10-CM

## 2015-04-17 DIAGNOSIS — R079 Chest pain, unspecified: Secondary | ICD-10-CM

## 2015-04-17 DIAGNOSIS — G8929 Other chronic pain: Secondary | ICD-10-CM

## 2015-04-17 MED ORDER — OXYCODONE HCL 15 MG PO TABS: 15.0000 mg | ORAL_TABLET | ORAL | Status: DC | PRN

## 2015-04-17 MED ORDER — ALPRAZOLAM 1 MG PO TABS: 1.0000 mg | ORAL_TABLET | Freq: Two times a day (BID) | ORAL | Status: DC | PRN

## 2015-05-16 ENCOUNTER — Other Ambulatory Visit: Payer: Self-pay | Admitting: *Deleted

## 2015-05-16 DIAGNOSIS — J40 Bronchitis, not specified as acute or chronic: Secondary | ICD-10-CM

## 2015-05-16 DIAGNOSIS — R079 Chest pain, unspecified: Secondary | ICD-10-CM

## 2015-05-16 DIAGNOSIS — G4483 Primary cough headache: Secondary | ICD-10-CM

## 2015-05-16 DIAGNOSIS — E559 Vitamin D deficiency, unspecified: Secondary | ICD-10-CM

## 2015-05-16 DIAGNOSIS — F411 Generalized anxiety disorder: Secondary | ICD-10-CM

## 2015-05-16 DIAGNOSIS — N644 Mastodynia: Secondary | ICD-10-CM

## 2015-05-16 DIAGNOSIS — G8929 Other chronic pain: Secondary | ICD-10-CM

## 2015-05-16 MED ORDER — OXYCODONE HCL 15 MG PO TABS: 15.0000 mg | ORAL_TABLET | ORAL | Status: DC | PRN

## 2015-05-16 MED ORDER — ALPRAZOLAM 1 MG PO TABS
1.0000 mg | ORAL_TABLET | Freq: Two times a day (BID) | ORAL | Status: DC | PRN
Start: 2015-05-16 — End: 2015-06-17

## 2015-06-17 ENCOUNTER — Other Ambulatory Visit: Payer: Self-pay | Admitting: *Deleted

## 2015-06-17 DIAGNOSIS — N644 Mastodynia: Secondary | ICD-10-CM

## 2015-06-17 DIAGNOSIS — R079 Chest pain, unspecified: Secondary | ICD-10-CM

## 2015-06-17 DIAGNOSIS — G4483 Primary cough headache: Secondary | ICD-10-CM

## 2015-06-17 DIAGNOSIS — F411 Generalized anxiety disorder: Secondary | ICD-10-CM

## 2015-06-17 DIAGNOSIS — E559 Vitamin D deficiency, unspecified: Secondary | ICD-10-CM

## 2015-06-17 DIAGNOSIS — G8929 Other chronic pain: Secondary | ICD-10-CM

## 2015-06-17 DIAGNOSIS — J40 Bronchitis, not specified as acute or chronic: Secondary | ICD-10-CM

## 2015-06-17 MED ORDER — OXYCODONE HCL 15 MG PO TABS: 15.0000 mg | ORAL_TABLET | ORAL | Status: DC | PRN

## 2015-06-17 MED ORDER — ALPRAZOLAM 1 MG PO TABS
1.0000 mg | ORAL_TABLET | Freq: Two times a day (BID) | ORAL | Status: DC | PRN
Start: 2015-06-17 — End: 2015-07-12

## 2015-06-21 ENCOUNTER — Other Ambulatory Visit: Payer: Self-pay

## 2015-06-21 ENCOUNTER — Ambulatory Visit: Payer: Self-pay | Admitting: Hematology & Oncology

## 2015-06-21 ENCOUNTER — Ambulatory Visit: Payer: Self-pay | Admitting: Family

## 2015-07-12 ENCOUNTER — Other Ambulatory Visit (HOSPITAL_BASED_OUTPATIENT_CLINIC_OR_DEPARTMENT_OTHER): Payer: Self-pay

## 2015-07-12 ENCOUNTER — Encounter: Payer: Self-pay | Admitting: Family

## 2015-07-12 ENCOUNTER — Ambulatory Visit (HOSPITAL_BASED_OUTPATIENT_CLINIC_OR_DEPARTMENT_OTHER): Payer: Self-pay | Admitting: Family

## 2015-07-12 VITALS — BP 154/84 | HR 76 | Temp 97.9°F | Resp 20 | Ht 63.0 in | Wt 249.0 lb

## 2015-07-12 DIAGNOSIS — C50919 Malignant neoplasm of unspecified site of unspecified female breast: Secondary | ICD-10-CM

## 2015-07-12 DIAGNOSIS — Z85528 Personal history of other malignant neoplasm of kidney: Secondary | ICD-10-CM

## 2015-07-12 DIAGNOSIS — G8929 Other chronic pain: Secondary | ICD-10-CM

## 2015-07-12 DIAGNOSIS — R079 Chest pain, unspecified: Secondary | ICD-10-CM

## 2015-07-12 DIAGNOSIS — F411 Generalized anxiety disorder: Secondary | ICD-10-CM

## 2015-07-12 DIAGNOSIS — Z853 Personal history of malignant neoplasm of breast: Secondary | ICD-10-CM

## 2015-07-12 DIAGNOSIS — C649 Malignant neoplasm of unspecified kidney, except renal pelvis: Secondary | ICD-10-CM

## 2015-07-12 DIAGNOSIS — N644 Mastodynia: Secondary | ICD-10-CM

## 2015-07-12 DIAGNOSIS — R0789 Other chest pain: Secondary | ICD-10-CM

## 2015-07-12 DIAGNOSIS — J329 Chronic sinusitis, unspecified: Secondary | ICD-10-CM

## 2015-07-12 DIAGNOSIS — G4483 Primary cough headache: Secondary | ICD-10-CM

## 2015-07-12 DIAGNOSIS — E559 Vitamin D deficiency, unspecified: Secondary | ICD-10-CM

## 2015-07-12 DIAGNOSIS — J40 Bronchitis, not specified as acute or chronic: Secondary | ICD-10-CM

## 2015-07-12 LAB — COMPREHENSIVE METABOLIC PANEL
ALBUMIN: 3.4 g/dL — AB (ref 3.5–5.0)
ALK PHOS: 91 U/L (ref 40–150)
ALT: 23 U/L (ref 0–55)
ANION GAP: 11 meq/L (ref 3–11)
AST: 22 U/L (ref 5–34)
BILIRUBIN TOTAL: 0.34 mg/dL (ref 0.20–1.20)
BUN: 14.4 mg/dL (ref 7.0–26.0)
CALCIUM: 9.3 mg/dL (ref 8.4–10.4)
CO2: 29 mEq/L (ref 22–29)
CREATININE: 0.7 mg/dL (ref 0.6–1.1)
Chloride: 101 mEq/L (ref 98–109)
EGFR: >90 mL/min/{1.73_m2} (ref >90)
Glucose: 155 mg/dl — ABNORMAL HIGH (ref 70–140)
Potassium: 4.4 mEq/L (ref 3.5–5.1)
Sodium: 140 mEq/L (ref 136–145)
TOTAL PROTEIN: 7 g/dL (ref 6.4–8.3)

## 2015-07-12 LAB — CBC WITH DIFFERENTIAL (CANCER CENTER ONLY)
BASO#: 0 10*3/uL (ref 0.0–0.2)
BASO%: 0.3 % (ref 0.0–2.0)
EOS%: 3.6 % (ref 0.0–7.0)
Eosinophils Absolute: 0.3 10*3/uL (ref 0.0–0.5)
HEMATOCRIT: 41.1 % (ref 34.8–46.6)
HEMOGLOBIN: 13.6 g/dL (ref 11.6–15.9)
LYMPH#: 1.5 10*3/uL (ref 0.9–3.3)
LYMPH%: 22 % (ref 14.0–48.0)
MCH: 31.2 pg (ref 26.0–34.0)
MCHC: 33.1 g/dL (ref 32.0–36.0)
MCV: 94 fL (ref 81–101)
MONO#: 0.4 10*3/uL (ref 0.1–0.9)
MONO%: 5.6 % (ref 0.0–13.0)
NEUT%: 68.5 % (ref 39.6–80.0)
NEUTROS ABS: 4.7 10*3/uL (ref 1.5–6.5)
Platelets: 174 10*3/uL (ref 145–400)
RBC: 4.36 10*6/uL (ref 3.70–5.32)
RDW: 12.7 % (ref 11.1–15.7)
WBC: 6.9 10*3/uL (ref 3.9–10.0)

## 2015-07-12 LAB — LACTATE DEHYDROGENASE: LDH: 209 U/L (ref 125–245)

## 2015-07-12 MED ORDER — HYDROCODONE-HOMATROPINE 5-1.5 MG/5ML PO SYRP: 5.0000 mL | ORAL_SOLUTION | Freq: Four times a day (QID) | ORAL | Status: DC | PRN

## 2015-07-12 MED ORDER — ALPRAZOLAM 1 MG PO TABS: 1.0000 mg | ORAL_TABLET | Freq: Two times a day (BID) | ORAL | Status: DC | PRN

## 2015-07-12 MED ORDER — OXYCODONE HCL 15 MG PO TABS: 15.0000 mg | ORAL_TABLET | ORAL | Status: DC | PRN

## 2015-07-12 NOTE — Progress Notes (Signed)
Hematology and Oncology Follow Up Visit  BONNY BELIN [id number] 1965-09-10 49 y.o. 07/12/2015   Principle Diagnosis:  1. 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 2. 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 Chronic chest wall pain  Current Therapy:   Observation    Interim History: Ms. Pry is here today for a follow-up. She has an upper respiratory infection and states that she is currently on Erythromycin and Neb treatments at home.She is symptomatic with a cough and SOB with exertion. She has some tenderness in the chest from her cough.  She has not been smoking for the last few days or using her vape.  No fever, chills, n/v, rash, dizziness, chest pain, palpitations, abdominal pain or changes in bowel or bladder habits.  She has chronic chest wall pain and this has been exacerbated with her cough. The pain is managed with her current medication regimen.  No episodes of bleeding or bruising. No lymphadenopathy found on exam.  She has had no swelling, tenderness, numbness or tingling in her extremities.  Her appetite has been down with since she has been sick but she states that she is staying well hydrated.  Her weight is back up 24 lbs since her last visit. She plans to start trying to lose weight again once she feels better.   Medications:    Medication List       This list is accurate as of: 07/12/15  9:30 AM.  Always use your most recent med list.               ALPRAZolam 1 MG tablet  Commonly known as:  XANAX  Take 1 tablet (1 mg total) by mouth 2 (two) times daily as needed for anxiety.     amLODipine 10 MG tablet  Commonly known as:  NORVASC  Take 1 tablet (10 mg total) by mouth daily before breakfast.     HYDROcodone-homatropine 5-1.5 MG/5ML syrup  Commonly known as:  HYCODAN  Take 5 mLs by mouth every 6 (six) hours as needed for cough.     lisinopril 10 MG tablet  Commonly known as:  PRINIVIL,ZESTRIL  Take 1  tablet (10 mg total) by mouth daily before breakfast.     oxyCODONE 15 MG immediate release tablet  Commonly known as:  ROXICODONE  Take 1 tablet (15 mg total) by mouth every 4 (four) hours as needed.     PREVACID 30 MG capsule  Generic drug:  lansoprazole  Take 30 mg by mouth 2 (two) times daily.        Allergies:  Allergies  Allergen Reactions  . Penicillins Anaphylaxis and Hives  . Sulfur Anaphylaxis and Hives  . Darvocet [Propoxyphene N-Acetaminophen]     Nausea, hives, and itching    . Doxycycline Nausea And Vomiting  . Morphine And Related Nausea Only  . Naproxen Nausea And Vomiting  . Nsaids Nausea And Vomiting  . Tape Other (See Comments)    Rips skin  . Toradol [Ketorolac Tromethamine] Nausea And Vomiting  . Ultracet [Tramadol-Acetaminophen] Hives, Itching and Nausea And Vomiting  . Ultram [Tramadol Hcl]     Nausea, hives, itching     Past Medical History, Surgical history, Social history, and Family History were reviewed and updated.  Review of Systems: All other 10 point review of systems is negative.   Physical Exam:  vitals were not taken for this visit.  Wt Readings from Last 3 Encounters:  03/22/15 225 lb (102.059 kg)  12/24/14 243 lb (110.224 kg)  09/24/14 244 lb (110.678 kg)    Ocular: Sclerae unicteric, pupils equal, round and reactive to light Ear-nose-throat: Oropharynx clear, dentition fair Lymphatic: No cervical supraclavicular or axillary adenopathy Lungs no rales or rhonchi, good excursion bilaterally Heart regular rate and rhythm, no murmur appreciated Abd soft, nontender, positive bowel sounds, no liver or spleen tip palpated on exam.  MSK no focal spinal tenderness, no joint edema Neuro: non-focal, well-oriented, appropriate affect Breasts: Surgical changes with bilateral mastectomy. No mass, lesion, rash or lymphadenopathy.   Lab Results  Component Value Date   WBC 6.9 07/12/2015   HGB 13.6 07/12/2015   HCT 41.1 07/12/2015    MCV 94 07/12/2015   PLT 174 07/12/2015   No results found for: FERRITIN, IRON, TIBC, UIBC, IRONPCTSAT Lab Results  Component Value Date   RBC 4.36 07/12/2015   No results found for: KPAFRELGTCHN, LAMBDASER, KAPLAMBRATIO No results found for: Kandis Cocking, IGMSERUM No results found for: Odetta Pink, SPEI   Chemistry      Component Value Date/Time   NA 142 03/22/2015 0938   NA 138 12/24/2014 1429   K 3.5 03/22/2015 0938   K 3.8 12/24/2014 1429   CL 102 03/22/2015 0938   CL 102 12/24/2014 1429   CO2 28 03/22/2015 0938   CO2 28 12/24/2014 1429   BUN 5* 03/22/2015 0938   BUN 5* 12/24/2014 1429   CREATININE 0.5* 03/22/2015 0938   CREATININE 0.56 12/24/2014 1429      Component Value Date/Time   CALCIUM 9.8 03/22/2015 0938   CALCIUM 9.2 12/24/2014 1429   ALKPHOS 94* 03/22/2015 0938   ALKPHOS 76 12/24/2014 1429   AST 23 03/22/2015 0938   AST 26 12/24/2014 1429   ALT 25 03/22/2015 0938   ALT 18 12/24/2014 1429   BILITOT 0.70 03/22/2015 0938   BILITOT 0.6 12/24/2014 1429     Impression and Plan: Ms. Ballen is 49 yo white female she is a past history of localized left breast cancer and renal cell carcinoma of the right kidney. She had bilateral mastectomies over 7 years ago. She has done well and so far there has been no evidence of recurrence.   She currently has a sinus infection and is on an antibiotic and neb treatments at home. She is still symptomatic with a cough and SOB with any exertion.  We will refill her cough syrup, pain medication and xanax today.  We will plan to see her back in 3 months for labs and follow-up. She knows to contact us with any questions or concerns. We can certainly see her sooner if need be.    Eliezer Bottom, NP 12/23/20169:30 AM

## 2015-07-13 LAB — VITAMIN D 25 HYDROXY (VIT D DEFICIENCY, FRACTURES): Vit D, 25-Hydroxy: 13 ng/mL — ABNORMAL LOW (ref 30–100)

## 2015-07-16 ENCOUNTER — Other Ambulatory Visit: Payer: Self-pay | Admitting: Family

## 2015-07-16 DIAGNOSIS — E559 Vitamin D deficiency, unspecified: Secondary | ICD-10-CM

## 2015-07-16 MED ORDER — VITAMIN D3 50 MCG (2000 UT) PO TABS: 1000.0000 [IU] | ORAL_TABLET | Freq: Every day | ORAL | Status: DC

## 2015-08-06 ENCOUNTER — Other Ambulatory Visit: Payer: Self-pay | Admitting: *Deleted

## 2015-08-06 DIAGNOSIS — G4483 Primary cough headache: Secondary | ICD-10-CM

## 2015-08-06 DIAGNOSIS — E559 Vitamin D deficiency, unspecified: Secondary | ICD-10-CM

## 2015-08-06 DIAGNOSIS — N644 Mastodynia: Secondary | ICD-10-CM

## 2015-08-06 DIAGNOSIS — R079 Chest pain, unspecified: Secondary | ICD-10-CM

## 2015-08-06 DIAGNOSIS — F411 Generalized anxiety disorder: Secondary | ICD-10-CM

## 2015-08-06 DIAGNOSIS — G8929 Other chronic pain: Secondary | ICD-10-CM

## 2015-08-06 DIAGNOSIS — J40 Bronchitis, not specified as acute or chronic: Secondary | ICD-10-CM

## 2015-08-06 MED ORDER — OXYCODONE HCL 15 MG PO TABS: 15.0000 mg | ORAL_TABLET | ORAL | Status: DC | PRN

## 2015-08-06 MED ORDER — ALPRAZOLAM 1 MG PO TABS: 1.0000 mg | ORAL_TABLET | Freq: Two times a day (BID) | ORAL | Status: DC | PRN

## 2015-08-07 MED FILL — oxyCODONE HCL 15 MG TABS: 15 | 15 days supply | Qty: 90 | Fill #0

## 2015-08-30 ENCOUNTER — Other Ambulatory Visit: Payer: Self-pay | Admitting: *Deleted

## 2015-08-30 DIAGNOSIS — J40 Bronchitis, not specified as acute or chronic: Secondary | ICD-10-CM

## 2015-08-30 DIAGNOSIS — E559 Vitamin D deficiency, unspecified: Secondary | ICD-10-CM

## 2015-08-30 DIAGNOSIS — G8929 Other chronic pain: Secondary | ICD-10-CM

## 2015-08-30 DIAGNOSIS — F411 Generalized anxiety disorder: Secondary | ICD-10-CM

## 2015-08-30 DIAGNOSIS — N644 Mastodynia: Secondary | ICD-10-CM

## 2015-08-30 DIAGNOSIS — G4483 Primary cough headache: Secondary | ICD-10-CM

## 2015-08-30 DIAGNOSIS — R079 Chest pain, unspecified: Secondary | ICD-10-CM

## 2015-08-30 MED ORDER — OXYCODONE HCL 15 MG PO TABS: 15.0000 mg | ORAL_TABLET | ORAL | Status: DC | PRN

## 2015-08-30 MED ORDER — ALPRAZOLAM 1 MG PO TABS: 1.0000 mg | ORAL_TABLET | Freq: Two times a day (BID) | ORAL | Status: DC | PRN

## 2015-09-02 MED FILL — ALPRAZolam 1 MG TABS: 1 | 30 days supply | Qty: 60 | Fill #0

## 2015-09-02 MED FILL — oxyCODONE HCL 15 MG TABS: 15 | 15 days supply | Qty: 90 | Fill #0

## 2015-09-23 ENCOUNTER — Other Ambulatory Visit: Payer: Self-pay | Admitting: *Deleted

## 2015-09-23 DIAGNOSIS — G4483 Primary cough headache: Secondary | ICD-10-CM

## 2015-09-23 DIAGNOSIS — F411 Generalized anxiety disorder: Secondary | ICD-10-CM

## 2015-09-23 DIAGNOSIS — G8929 Other chronic pain: Secondary | ICD-10-CM

## 2015-09-23 DIAGNOSIS — E559 Vitamin D deficiency, unspecified: Secondary | ICD-10-CM

## 2015-09-23 DIAGNOSIS — R079 Chest pain, unspecified: Secondary | ICD-10-CM

## 2015-09-23 DIAGNOSIS — N644 Mastodynia: Secondary | ICD-10-CM

## 2015-09-23 DIAGNOSIS — J40 Bronchitis, not specified as acute or chronic: Secondary | ICD-10-CM

## 2015-09-23 MED ORDER — ALPRAZOLAM 1 MG PO TABS: 1.0000 mg | ORAL_TABLET | Freq: Two times a day (BID) | ORAL | Status: DC | PRN

## 2015-09-23 MED ORDER — OXYCODONE HCL 15 MG PO TABS: 15.0000 mg | ORAL_TABLET | ORAL | Status: DC | PRN

## 2015-09-23 MED FILL — oxyCODONE HCL 15 MG TABS: 15 | 15 days supply | Qty: 90 | Fill #0

## 2015-09-23 MED FILL — ALPRAZolam 1 MG TABS: 1 | 30 days supply | Qty: 60 | Fill #0

## 2015-10-03 ENCOUNTER — Ambulatory Visit: Payer: Self-pay | Admitting: Hematology & Oncology

## 2015-10-03 ENCOUNTER — Other Ambulatory Visit: Payer: Self-pay

## 2015-10-11 ENCOUNTER — Ambulatory Visit: Payer: Self-pay | Admitting: Family

## 2015-10-11 ENCOUNTER — Other Ambulatory Visit: Payer: Self-pay

## 2015-10-21 ENCOUNTER — Ambulatory Visit: Payer: Self-pay | Admitting: Family

## 2015-10-21 ENCOUNTER — Encounter: Payer: Self-pay | Admitting: Family

## 2015-10-21 ENCOUNTER — Ambulatory Visit (HOSPITAL_BASED_OUTPATIENT_CLINIC_OR_DEPARTMENT_OTHER): Payer: Medicaid Other | Admitting: Family

## 2015-10-21 ENCOUNTER — Other Ambulatory Visit: Payer: Self-pay | Admitting: *Deleted

## 2015-10-21 ENCOUNTER — Other Ambulatory Visit: Payer: Self-pay

## 2015-10-21 ENCOUNTER — Other Ambulatory Visit (HOSPITAL_BASED_OUTPATIENT_CLINIC_OR_DEPARTMENT_OTHER): Payer: Medicaid Other

## 2015-10-21 VITALS — BP 157/95 | HR 63 | Temp 98.1°F | Resp 18 | Ht 63.0 in | Wt 247.0 lb

## 2015-10-21 DIAGNOSIS — G8929 Other chronic pain: Secondary | ICD-10-CM

## 2015-10-21 DIAGNOSIS — Z85528 Personal history of other malignant neoplasm of kidney: Secondary | ICD-10-CM

## 2015-10-21 DIAGNOSIS — Z853 Personal history of malignant neoplasm of breast: Secondary | ICD-10-CM

## 2015-10-21 DIAGNOSIS — G4483 Primary cough headache: Secondary | ICD-10-CM

## 2015-10-21 DIAGNOSIS — F411 Generalized anxiety disorder: Secondary | ICD-10-CM

## 2015-10-21 DIAGNOSIS — E559 Vitamin D deficiency, unspecified: Secondary | ICD-10-CM

## 2015-10-21 DIAGNOSIS — R0789 Other chest pain: Secondary | ICD-10-CM | POA: Diagnosis not present

## 2015-10-21 DIAGNOSIS — N644 Mastodynia: Secondary | ICD-10-CM

## 2015-10-21 DIAGNOSIS — R079 Chest pain, unspecified: Secondary | ICD-10-CM

## 2015-10-21 DIAGNOSIS — J40 Bronchitis, not specified as acute or chronic: Secondary | ICD-10-CM

## 2015-10-21 DIAGNOSIS — C641 Malignant neoplasm of right kidney, except renal pelvis: Secondary | ICD-10-CM

## 2015-10-21 DIAGNOSIS — C50912 Malignant neoplasm of unspecified site of left female breast: Secondary | ICD-10-CM

## 2015-10-21 LAB — COMPREHENSIVE METABOLIC PANEL
ALK PHOS: 106 U/L (ref 40–150)
ALT: 30 U/L (ref 0–55)
ANION GAP: 9 meq/L (ref 3–11)
AST: 27 U/L (ref 5–34)
Albumin: 3.5 g/dL (ref 3.5–5.0)
BILIRUBIN TOTAL: 0.61 mg/dL (ref 0.20–1.20)
BUN: 11.1 mg/dL (ref 7.0–26.0)
CALCIUM: 9.6 mg/dL (ref 8.4–10.4)
CO2: 29 meq/L (ref 22–29)
CREATININE: 0.8 mg/dL (ref 0.6–1.1)
Chloride: 103 mEq/L (ref 98–109)
Glucose: 108 mg/dl (ref 70–140)
Potassium: 4.9 mEq/L (ref 3.5–5.1)
Sodium: 141 mEq/L (ref 136–145)
TOTAL PROTEIN: 7.1 g/dL (ref 6.4–8.3)

## 2015-10-21 LAB — CBC WITH DIFFERENTIAL (CANCER CENTER ONLY)
BASO#: 0 10*3/uL (ref 0.0–0.2)
BASO%: 0.3 % (ref 0.0–2.0)
EOS ABS: 0.2 10*3/uL (ref 0.0–0.5)
EOS%: 2.9 % (ref 0.0–7.0)
HEMATOCRIT: 42.3 % (ref 34.8–46.6)
HGB: 14.5 g/dL (ref 11.6–15.9)
LYMPH#: 1.8 10*3/uL (ref 0.9–3.3)
LYMPH%: 22.3 % (ref 14.0–48.0)
MCH: 31.3 pg (ref 26.0–34.0)
MCHC: 34.3 g/dL (ref 32.0–36.0)
MCV: 91 fL (ref 81–101)
MONO#: 0.6 10*3/uL (ref 0.1–0.9)
MONO%: 7.8 % (ref 0.0–13.0)
NEUT#: 5.3 10*3/uL (ref 1.5–6.5)
NEUT%: 66.7 % (ref 39.6–80.0)
PLATELETS: 162 10*3/uL (ref 145–400)
RBC: 4.63 10*6/uL (ref 3.70–5.32)
RDW: 13.1 % (ref 11.1–15.7)
WBC: 7.9 10*3/uL (ref 3.9–10.0)

## 2015-10-21 LAB — LACTATE DEHYDROGENASE: LDH: 177 U/L (ref 125–245)

## 2015-10-21 MED ORDER — ALPRAZOLAM 1 MG PO TABS: 1.0000 mg | ORAL_TABLET | Freq: Two times a day (BID) | ORAL | Status: DC | PRN

## 2015-10-21 MED ORDER — OXYCODONE HCL 15 MG PO TABS: 15.0000 mg | ORAL_TABLET | ORAL | Status: DC | PRN

## 2015-10-21 MED FILL — oxyCODONE HCL 15 MG TABS: 15 | 15 days supply | Qty: 90 | Fill #0

## 2015-10-21 MED FILL — ALPRAZolam 1 MG TABS: 1 | 30 days supply | Qty: 60 | Fill #0

## 2015-10-21 NOTE — Progress Notes (Signed)
Hematology and Oncology Follow Up Visit  NUR GOSSMAN [id number] January 21, 1966 50 y.o. 10/21/2015   Principle Diagnosis:  1. 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 2. 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 Chronic chest wall pain  Current Therapy:   Observation    Interim History: Ms. Pinos is here today for a follow-up. She is doing fairly well. She has moved into her own apartment and is enjoying being independent.  She now has Medicaid and would like to get an appointment with a PCP. I advised her to go downstairs to [employer]. She plans to stop there on her way out today and make an appointment.   No changes with her chest. She had bilateral mastectomies. She has a skin flap on the right side s/p mastectomy that "burns" when she lays on it. No mass, lesion rash of lymphadenopathy found on exam.   No fever, chills, n/v, rash, dizziness, chest pain, palpitations, abdominal pain or changes in bowel or bladder habits.  She has SOB and cough due to COPD. She is still smoking less than 1/2 ppd. She went to [facility] ED a few weeks ago for the cough and SOB and she states that her chest xray was negative and she was treated with breathing treatments.  She does have episodes of anxiety since her father passed away and is on Xanax BID PRN.  No episodes of bleeding or bruising.  She states that she has a hernia in the right side that is painful at times. She would like to have repaired eventually.   She has had no swelling, tenderness, numbness or tingling in her extremities.  Her appetite comes and goes. She is staying well hydrated. Her weight is stable.   Medications:    Medication List       This list is accurate as of: 10/21/15 11:58 AM.  Always use your most recent med list.               ALPRAZolam 1 MG tablet  Commonly known as:  XANAX  Take 1 tablet (1 mg total) by mouth 2 (two) times daily as needed for anxiety.     amLODipine 10 MG  tablet  Commonly known as:  NORVASC  Take 1 tablet (10 mg total) by mouth daily before breakfast.     HYDROcodone-homatropine 5-1.5 MG/5ML syrup  Commonly known as:  HYCODAN  Take 5 mLs by mouth every 6 (six) hours as needed for cough.     lisinopril 10 MG tablet  Commonly known as:  PRINIVIL,ZESTRIL  Take 1 tablet (10 mg total) by mouth daily before breakfast.     oxyCODONE 15 MG immediate release tablet  Commonly known as:  ROXICODONE  Take 1 tablet (15 mg total) by mouth every 4 (four) hours as needed.     PREVACID 30 MG capsule  Generic drug:  lansoprazole  Take 30 mg by mouth 2 (two) times daily.     Vitamin D3 2000 units Tabs  Take 1,000 Units by mouth daily.        Allergies:  Allergies  Allergen Reactions  . Penicillins Anaphylaxis and Hives  . Sulfur Anaphylaxis and Hives  . Darvocet [Propoxyphene N-Acetaminophen]     Nausea, hives, and itching    . Doxycycline Nausea And Vomiting  . Morphine And Related Nausea Only  . Naproxen Nausea And Vomiting  . Nsaids Nausea And Vomiting  . Tape Other (See  Comments)    Rips skin  . Toradol [Ketorolac Tromethamine] Nausea And Vomiting  . Ultracet [Tramadol-Acetaminophen] Hives, Itching and Nausea And Vomiting  . Ultram [Tramadol Hcl]     Nausea, hives, itching     Past Medical History, Surgical history, Social history, and Family History were reviewed and updated.  Review of Systems: All other 10 point review of systems is negative.   Physical Exam:  height is 5\' 3"  (1.6 m) and weight is 247 lb (112.038 kg). Her oral temperature is 98.1 F (36.7 C). Her blood pressure is 157/95 and her pulse is 63. Her respiration is 18.   Wt Readings from Last 3 Encounters:  10/21/15 247 lb (112.038 kg)  07/12/15 249 lb (112.946 kg)  03/22/15 225 lb (102.059 kg)    Ocular: Sclerae unicteric, pupils equal, round and reactive to light Ear-nose-throat: Oropharynx clear, dentition fair Lymphatic: No cervical supraclavicular or  axillary adenopathy Lungs no rales or rhonchi, good excursion bilaterally Heart regular rate and rhythm, no murmur appreciated Abd soft, nontender, positive bowel sounds, no liver or spleen tip palpated on exam, no fluid wave  MSK no focal spinal tenderness, no joint edema Neuro: non-focal, well-oriented, appropriate affect Breasts: Surgical changes with bilateral mastectomy. No mass, lesion, rash or lymphadenopathy.   Lab Results  Component Value Date   WBC 7.9 10/21/2015   HGB 14.5 10/21/2015   HCT 42.3 10/21/2015   MCV 91 10/21/2015   PLT 162 10/21/2015   No results found for: FERRITIN, IRON, TIBC, UIBC, IRONPCTSAT Lab Results  Component Value Date   RBC 4.63 10/21/2015   No results found for: KPAFRELGTCHN, LAMBDASER, KAPLAMBRATIO No results found for: IGGSERUM, IGA, IGMSERUM No results found for: Odetta Pink, SPEI   Chemistry      Component Value Date/Time   NA 140 07/12/2015 0911   NA 142 03/22/2015 0938   NA 138 12/24/2014 1429   K 4.4 07/12/2015 0911   K 3.5 03/22/2015 0938   K 3.8 12/24/2014 1429   CL 102 03/22/2015 0938   CL 102 12/24/2014 1429   CO2 29 07/12/2015 0911   CO2 28 03/22/2015 0938   CO2 28 12/24/2014 1429   BUN 14.4 07/12/2015 0911   BUN 5* 03/22/2015 0938   BUN 5* 12/24/2014 1429   CREATININE 0.7 07/12/2015 0911   CREATININE 0.5* 03/22/2015 0938   CREATININE 0.56 12/24/2014 1429      Component Value Date/Time   CALCIUM 9.3 07/12/2015 0911   CALCIUM 9.8 03/22/2015 0938   CALCIUM 9.2 12/24/2014 1429   ALKPHOS 91 07/12/2015 0911   ALKPHOS 94* 03/22/2015 0938   ALKPHOS 76 12/24/2014 1429   AST 22 07/12/2015 0911   AST 23 03/22/2015 0938   AST 26 12/24/2014 1429   ALT 23 07/12/2015 0911   ALT 25 03/22/2015 0938   ALT 18 12/24/2014 1429   BILITOT 0.34 07/12/2015 0911   BILITOT 0.70 03/22/2015 0938   BILITOT 0.6 12/24/2014 1429     Impression and Plan: Ms. Schenk is 50 yo white  female she is a past history of localized left breast cancer and renal cell carcinoma of the right kidney. She had bilateral mastectomies over 7 years ago. She has done well and so far there has been no evidence of recurrence.   She is now living by herself and enjoying her independence. She has no complaints at this time.  We will refill her pain medication and xanax today.  We will  plan to see her back in 4 months for labs and follow-up. She knows to contact us with any questions or concerns. We can certainly see her sooner if need be.    Eliezer Bottom, NP 4/3/201711:58 AM

## 2015-10-22 ENCOUNTER — Other Ambulatory Visit: Payer: Self-pay | Admitting: *Deleted

## 2015-10-22 ENCOUNTER — Other Ambulatory Visit: Payer: Self-pay | Admitting: Family

## 2015-10-22 DIAGNOSIS — G4483 Primary cough headache: Secondary | ICD-10-CM

## 2015-10-22 DIAGNOSIS — E559 Vitamin D deficiency, unspecified: Secondary | ICD-10-CM

## 2015-10-22 DIAGNOSIS — N644 Mastodynia: Secondary | ICD-10-CM

## 2015-10-22 DIAGNOSIS — G8929 Other chronic pain: Secondary | ICD-10-CM

## 2015-10-22 DIAGNOSIS — J40 Bronchitis, not specified as acute or chronic: Secondary | ICD-10-CM

## 2015-10-22 DIAGNOSIS — F411 Generalized anxiety disorder: Secondary | ICD-10-CM

## 2015-10-22 DIAGNOSIS — R079 Chest pain, unspecified: Secondary | ICD-10-CM

## 2015-10-22 LAB — VITAMIN D 25 HYDROXY (VIT D DEFICIENCY, FRACTURES): VIT D 25 HYDROXY: 11.2 ng/mL — AB (ref 30.0–100.0)

## 2015-10-22 MED ORDER — VITAMIN D3 50 MCG (2000 UT) PO TABS: 2000.0000 [IU] | ORAL_TABLET | Freq: Every day | ORAL | Status: DC

## 2015-10-22 MED ORDER — LISINOPRIL 10 MG PO TABS: 10.0000 mg | ORAL_TABLET | Freq: Every day | ORAL | Status: AC

## 2015-10-22 MED ORDER — HYDROCODONE-HOMATROPINE 5-1.5 MG/5ML PO SYRP: 5.0000 mL | ORAL_SOLUTION | Freq: Four times a day (QID) | ORAL | Status: DC | PRN

## 2015-10-22 MED FILL — LISINOPRIL 10 MG TABLET: 10 | 30 days supply | Qty: 30 | Fill #0

## 2015-10-22 MED FILL — VITAMIN D 1,000 UNITS TAB: 25 MCG | 50 days supply | Qty: 100 | Fill #0

## 2015-10-23 MED FILL — HYDROCODONE-HOMATROPINE SYR: 5-1.5 | 9 days supply | Qty: 180 | Fill #0

## 2015-11-15 ENCOUNTER — Other Ambulatory Visit: Payer: Self-pay | Admitting: *Deleted

## 2015-11-15 DIAGNOSIS — N644 Mastodynia: Secondary | ICD-10-CM

## 2015-11-15 DIAGNOSIS — G8929 Other chronic pain: Secondary | ICD-10-CM

## 2015-11-15 DIAGNOSIS — G4483 Primary cough headache: Secondary | ICD-10-CM

## 2015-11-15 DIAGNOSIS — J40 Bronchitis, not specified as acute or chronic: Secondary | ICD-10-CM

## 2015-11-15 DIAGNOSIS — R079 Chest pain, unspecified: Secondary | ICD-10-CM

## 2015-11-15 DIAGNOSIS — F411 Generalized anxiety disorder: Secondary | ICD-10-CM

## 2015-11-15 DIAGNOSIS — E559 Vitamin D deficiency, unspecified: Secondary | ICD-10-CM

## 2015-11-15 MED ORDER — ALPRAZOLAM 1 MG PO TABS: 1.0000 mg | ORAL_TABLET | Freq: Two times a day (BID) | ORAL | Status: DC | PRN

## 2015-11-15 MED ORDER — OXYCODONE HCL 15 MG PO TABS: 15.0000 mg | ORAL_TABLET | ORAL | Status: DC | PRN

## 2015-11-18 MED FILL — oxyCODONE HCL 15 MG TABS: 15 | 15 days supply | Qty: 90 | Fill #0

## 2015-11-18 MED FILL — ALPRAZolam 1 MG TABS: 1 | 30 days supply | Qty: 60 | Fill #0

## 2015-12-13 ENCOUNTER — Other Ambulatory Visit: Payer: Self-pay | Admitting: *Deleted

## 2015-12-13 DIAGNOSIS — G4483 Primary cough headache: Secondary | ICD-10-CM

## 2015-12-13 DIAGNOSIS — E559 Vitamin D deficiency, unspecified: Secondary | ICD-10-CM

## 2015-12-13 DIAGNOSIS — R079 Chest pain, unspecified: Secondary | ICD-10-CM

## 2015-12-13 DIAGNOSIS — F411 Generalized anxiety disorder: Secondary | ICD-10-CM

## 2015-12-13 DIAGNOSIS — N644 Mastodynia: Secondary | ICD-10-CM

## 2015-12-13 DIAGNOSIS — J40 Bronchitis, not specified as acute or chronic: Secondary | ICD-10-CM

## 2015-12-13 DIAGNOSIS — G8929 Other chronic pain: Secondary | ICD-10-CM

## 2015-12-13 MED ORDER — ALPRAZOLAM 1 MG PO TABS: 1.0000 mg | ORAL_TABLET | Freq: Two times a day (BID) | ORAL | Status: DC | PRN

## 2015-12-13 MED ORDER — OXYCODONE HCL 15 MG PO TABS: 15.0000 mg | ORAL_TABLET | ORAL | Status: DC | PRN

## 2015-12-13 MED FILL — oxyCODONE HCL 15 MG TABS: 15 | 15 days supply | Qty: 90 | Fill #0

## 2015-12-13 MED FILL — ALPRAZolam 1 MG TABS: 1 | 30 days supply | Qty: 60 | Fill #0

## 2016-01-08 ENCOUNTER — Other Ambulatory Visit: Payer: Self-pay | Admitting: *Deleted

## 2016-01-08 DIAGNOSIS — R079 Chest pain, unspecified: Secondary | ICD-10-CM

## 2016-01-08 DIAGNOSIS — F411 Generalized anxiety disorder: Secondary | ICD-10-CM

## 2016-01-08 DIAGNOSIS — N644 Mastodynia: Secondary | ICD-10-CM

## 2016-01-08 DIAGNOSIS — E559 Vitamin D deficiency, unspecified: Secondary | ICD-10-CM

## 2016-01-08 DIAGNOSIS — G4483 Primary cough headache: Secondary | ICD-10-CM

## 2016-01-08 DIAGNOSIS — J40 Bronchitis, not specified as acute or chronic: Secondary | ICD-10-CM

## 2016-01-08 DIAGNOSIS — G8929 Other chronic pain: Secondary | ICD-10-CM

## 2016-01-08 MED ORDER — ALPRAZOLAM 1 MG PO TABS
1.0000 mg | ORAL_TABLET | Freq: Two times a day (BID) | ORAL | Status: DC | PRN
Start: 2016-01-08 — End: 2016-02-05

## 2016-01-08 MED ORDER — OXYCODONE HCL 15 MG PO TABS: 15.0000 mg | ORAL_TABLET | ORAL | Status: DC | PRN

## 2016-01-09 MED FILL — ALPRAZolam 1 MG TABS: 1 | 30 days supply | Qty: 60 | Fill #0

## 2016-01-09 MED FILL — oxyCODONE HCL 15 MG TABS: 15 | 15 days supply | Qty: 90 | Fill #0

## 2016-01-10 ENCOUNTER — Encounter: Payer: Self-pay | Admitting: Genetic Counselor

## 2016-02-05 ENCOUNTER — Other Ambulatory Visit: Payer: Self-pay

## 2016-02-05 DIAGNOSIS — G8929 Other chronic pain: Secondary | ICD-10-CM

## 2016-02-05 DIAGNOSIS — N644 Mastodynia: Secondary | ICD-10-CM

## 2016-02-05 DIAGNOSIS — R079 Chest pain, unspecified: Secondary | ICD-10-CM

## 2016-02-05 DIAGNOSIS — E559 Vitamin D deficiency, unspecified: Secondary | ICD-10-CM

## 2016-02-05 DIAGNOSIS — G4483 Primary cough headache: Secondary | ICD-10-CM

## 2016-02-05 DIAGNOSIS — F411 Generalized anxiety disorder: Secondary | ICD-10-CM

## 2016-02-05 DIAGNOSIS — J40 Bronchitis, not specified as acute or chronic: Secondary | ICD-10-CM

## 2016-02-05 MED ORDER — ALPRAZOLAM 1 MG PO TABS: 1.0000 mg | ORAL_TABLET | Freq: Two times a day (BID) | ORAL | Status: DC | PRN

## 2016-02-05 MED ORDER — OXYCODONE HCL 15 MG PO TABS: 15.0000 mg | ORAL_TABLET | ORAL | Status: DC | PRN

## 2016-02-07 MED FILL — ALPRAZolam 1 MG TABS: 1 | 30 days supply | Qty: 60 | Fill #0

## 2016-02-07 MED FILL — oxyCODONE HCL 15 MG TABS: 15 | 15 days supply | Qty: 90 | Fill #0

## 2016-02-18 ENCOUNTER — Other Ambulatory Visit: Payer: Medicaid Other

## 2016-02-18 ENCOUNTER — Ambulatory Visit: Payer: Medicaid Other | Admitting: Family

## 2016-03-04 ENCOUNTER — Other Ambulatory Visit (HOSPITAL_BASED_OUTPATIENT_CLINIC_OR_DEPARTMENT_OTHER): Payer: Medicaid Other

## 2016-03-04 ENCOUNTER — Ambulatory Visit (HOSPITAL_BASED_OUTPATIENT_CLINIC_OR_DEPARTMENT_OTHER): Payer: Medicaid Other | Admitting: Family

## 2016-03-04 ENCOUNTER — Other Ambulatory Visit: Payer: Medicaid Other

## 2016-03-04 ENCOUNTER — Ambulatory Visit: Payer: Medicaid Other | Admitting: Family

## 2016-03-04 DIAGNOSIS — Z85528 Personal history of other malignant neoplasm of kidney: Secondary | ICD-10-CM | POA: Diagnosis not present

## 2016-03-04 DIAGNOSIS — E559 Vitamin D deficiency, unspecified: Secondary | ICD-10-CM

## 2016-03-04 DIAGNOSIS — Z853 Personal history of malignant neoplasm of breast: Secondary | ICD-10-CM

## 2016-03-04 DIAGNOSIS — F411 Generalized anxiety disorder: Secondary | ICD-10-CM

## 2016-03-04 DIAGNOSIS — J40 Bronchitis, not specified as acute or chronic: Secondary | ICD-10-CM

## 2016-03-04 DIAGNOSIS — C50912 Malignant neoplasm of unspecified site of left female breast: Secondary | ICD-10-CM

## 2016-03-04 DIAGNOSIS — N644 Mastodynia: Secondary | ICD-10-CM

## 2016-03-04 DIAGNOSIS — G8929 Other chronic pain: Secondary | ICD-10-CM

## 2016-03-04 DIAGNOSIS — G4483 Primary cough headache: Secondary | ICD-10-CM

## 2016-03-04 DIAGNOSIS — R079 Chest pain, unspecified: Secondary | ICD-10-CM

## 2016-03-04 DIAGNOSIS — C641 Malignant neoplasm of right kidney, except renal pelvis: Secondary | ICD-10-CM

## 2016-03-04 LAB — COMPREHENSIVE METABOLIC PANEL
ALT: 19 U/L (ref 0–55)
ANION GAP: 9 meq/L (ref 3–11)
AST: 19 U/L (ref 5–34)
Albumin: 3.3 g/dL — ABNORMAL LOW (ref 3.5–5.0)
Alkaline Phosphatase: 118 U/L (ref 40–150)
BUN: 17.1 mg/dL (ref 7.0–26.0)
CALCIUM: 9.3 mg/dL (ref 8.4–10.4)
CHLORIDE: 106 meq/L (ref 98–109)
CO2: 24 meq/L (ref 22–29)
Creatinine: 0.7 mg/dL (ref 0.6–1.1)
EGFR: >90 mL/min/{1.73_m2} (ref >90)
Glucose: 130 mg/dl (ref 70–140)
POTASSIUM: 4.4 meq/L (ref 3.5–5.1)
Sodium: 139 mEq/L (ref 136–145)
Total Bilirubin: 0.43 mg/dL (ref 0.20–1.20)
Total Protein: 7.4 g/dL (ref 6.4–8.3)

## 2016-03-04 LAB — CBC WITH DIFFERENTIAL (CANCER CENTER ONLY)
BASO#: 0 10*3/uL (ref 0.0–0.2)
BASO%: 0.3 % (ref 0.0–2.0)
EOS ABS: 0.2 10*3/uL (ref 0.0–0.5)
EOS%: 2.7 % (ref 0.0–7.0)
HEMATOCRIT: 41.2 % (ref 34.8–46.6)
HGB: 14.4 g/dL (ref 11.6–15.9)
LYMPH#: 1.6 10*3/uL (ref 0.9–3.3)
LYMPH%: 17.6 % (ref 14.0–48.0)
MCH: 32 pg (ref 26.0–34.0)
MCHC: 35 g/dL (ref 32.0–36.0)
MCV: 92 fL (ref 81–101)
MONO#: 0.7 10*3/uL (ref 0.1–0.9)
MONO%: 7.4 % (ref 0.0–13.0)
NEUT#: 6.4 10*3/uL (ref 1.5–6.5)
NEUT%: 72 % (ref 39.6–80.0)
Platelets: 292 10*3/uL (ref 145–400)
RBC: 4.5 10*6/uL (ref 3.70–5.32)
RDW: 13.7 % (ref 11.1–15.7)
WBC: 8.8 10*3/uL (ref 3.9–10.0)

## 2016-03-04 LAB — LACTATE DEHYDROGENASE: LDH: 194 U/L (ref 125–245)

## 2016-03-04 MED ORDER — OXYCODONE HCL 15 MG PO TABS: 15.0000 mg | ORAL_TABLET | ORAL | 0 refills | Status: DC | PRN

## 2016-03-04 MED ORDER — HYDROCODONE-HOMATROPINE 5-1.5 MG/5ML PO SYRP: 5.0000 mL | ORAL_SOLUTION | Freq: Four times a day (QID) | ORAL | 0 refills | Status: DC | PRN

## 2016-03-04 MED ORDER — ALPRAZOLAM 1 MG PO TABS: 1.0000 mg | ORAL_TABLET | Freq: Two times a day (BID) | ORAL | 0 refills | Status: DC | PRN

## 2016-03-04 MED FILL — ALPRAZolam 1 MG TABS: 1 | 30 days supply | Qty: 60 | Fill #0

## 2016-03-04 MED FILL — HYDROCODONE-HOMATROPINE SYR: 5-1.5 | 9 days supply | Qty: 180 | Fill #0

## 2016-03-04 MED FILL — oxyCODONE HCL 15 MG TABS: 15 | 15 days supply | Qty: 90 | Fill #0

## 2016-03-04 NOTE — Progress Notes (Signed)
Hematology and Oncology Follow Up Visit  [name] [id number] 05-Dec-1965 50 y.o. 03/04/2016   Principle Diagnosis:  1. 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 2. 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 Chronic chest wall pain  Current Therapy:   Observation    Interim History: [name] is here today for a follow-up. She is feeling a bit fatigued today. She napped yesterday and did not sleep well last night. She has moved back in with [name] but is hoping to find a mobile home soon to move into. She really enjoys Carolyn privacy.  No changes with Carolyn chest. She had bilateral mastectomies. She has a skin flap on the right side s/p mastectomy that bothers Carolyn. She plans to follow-up with Carolyn surgeon [name] to see if it can be removed. No mass, lesion rash of lymphadenopathy found on exam.   No fever, chills, n/v, rash, dizziness, chest pain, palpitations, abdominal pain or changes in bowel or bladder habits.  Carolyn SOB and cough due to COPD are unchanged. She is still smoking < 1/2 ppd.  No episodes of bleeding or bruising.  No swelling, tenderness, numbness or tingling in Carolyn extremities.  Carolyn appetite is improved and she is staying well hydrated. Carolyn weight is stable.  She will be having [name] teeth extracted tomorrow and get new upper and lower dentures.   Medications:    Medication List       Accurate as of 03/04/16  9:27 AM. Always use your most recent med list.          ALPRAZolam 1 MG tablet Commonly known as:  XANAX Take 1 tablet (1 mg total) by mouth 2 (two) times daily as needed for anxiety.   amLODipine 10 MG tablet Commonly known as:  NORVASC Take 1 tablet (10 mg total) by mouth daily before breakfast.   HYDROcodone-homatropine 5-1.5 MG/5ML syrup Commonly known as:  HYCODAN Take 5 mLs by mouth every 6 (six) hours as needed for cough.   lisinopril 10 MG tablet Commonly known as:  PRINIVIL,ZESTRIL Take 1  tablet (10 mg total) by mouth daily before breakfast.   oxyCODONE 15 MG immediate release tablet Commonly known as:  ROXICODONE Take 1 tablet (15 mg total) by mouth every 4 (four) hours as needed.   PREVACID 30 MG capsule Generic drug:  lansoprazole Take 30 mg by mouth 2 (two) times daily.   Vitamin D3 2000 units Tabs Take 2,000 Units by mouth daily.       Allergies:  Allergies  Allergen Reactions  . Penicillins Anaphylaxis and Hives  . Sulfur Anaphylaxis and Hives  . Darvocet [Propoxyphene N-Acetaminophen]     Nausea, hives, and itching    . Doxycycline Nausea And Vomiting  . Morphine And Related Nausea Only  . Naproxen Nausea And Vomiting  . Nsaids Nausea And Vomiting  . Tape Other (See Comments)    Rips skin  . Toradol [Ketorolac Tromethamine] Nausea And Vomiting  . Ultracet [Tramadol-Acetaminophen] Hives, Itching and Nausea And Vomiting  . Ultram [Tramadol Hcl]     Nausea, hives, itching     Past Medical History, Surgical history, Social history, and Family History were reviewed and updated.  Review of Systems: All other 10 point review of systems is negative.   Physical Exam:  weight is 251 lb (113.9 kg).   Wt Readings from Last 3 Encounters:  03/04/16 251 lb (113.9 kg)  10/21/15 247  lb (112 kg)  07/12/15 249 lb (112.9 kg)    Ocular: Sclerae unicteric, pupils equal, round and reactive to light Ear-nose-throat: Oropharynx clear, dentition fair Lymphatic: No cervical supraclavicular or axillary adenopathy Lungs no rales or rhonchi, good excursion bilaterally Heart regular rate and rhythm, no murmur appreciated Abd soft, nontender, positive bowel sounds, no liver or spleen tip palpated on exam, no fluid wave  MSK no focal spinal tenderness, no joint edema Neuro: non-focal, well-oriented, appropriate affect Breasts: Surgical changes with bilateral mastectomy. No mass, lesion, rash or lymphadenopathy.   Lab Results  Component Value Date   WBC 8.8  03/04/2016   HGB 14.4 03/04/2016   HCT 41.2 03/04/2016   MCV 92 03/04/2016   PLT 292 03/04/2016   No results found for: FERRITIN, IRON, TIBC, UIBC, IRONPCTSAT Lab Results  Component Value Date   RBC 4.50 03/04/2016   No results found for: KPAFRELGTCHN, LAMBDASER, KAPLAMBRATIO No results found for: IGGSERUM, IGA, IGMSERUM No results found for: Odetta Pink, SPEI   Chemistry      Component Value Date/Time   NA 141 10/21/2015 1058   K 4.9 10/21/2015 1058   CL 102 03/22/2015 0938   CO2 29 10/21/2015 1058   BUN 11.1 10/21/2015 1058   CREATININE 0.8 10/21/2015 1058      Component Value Date/Time   CALCIUM 9.6 10/21/2015 1058   ALKPHOS 106 10/21/2015 1058   AST 27 10/21/2015 1058   ALT 30 10/21/2015 1058   BILITOT 0.61 10/21/2015 1058     Impression and Plan: [name] is 50 yo white female she is a past history of localized left breast cancer and renal cell carcinoma of the right kidney. She had bilateral mastectomies over 7 years ago. She continues to do well and so far there has been no evidence of recurrence. She is staying busy with Carolyn family and looking for a new place to live. She is taking vitamin D daily.  Refill prescriptions for xanax, hycodan and oxycodone given to patient today.  We will plan to see Carolyn back in 4 months for labs and follow-up. She knows to contact us with any questions or concerns. We can certainly see Carolyn sooner if need be.    Carolyn Bottom, NP 8/16/20179:27 AM

## 2016-03-05 ENCOUNTER — Telehealth: Payer: Self-pay | Admitting: *Deleted

## 2016-03-05 LAB — VITAMIN D 25 HYDROXY (VIT D DEFICIENCY, FRACTURES): VIT D 25 HYDROXY: 19.7 ng/mL — AB (ref 30.0–100.0)

## 2016-03-05 NOTE — Telephone Encounter (Signed)
-----   Message from Eliezer Bottom, NP sent at 03/05/2016  1:27 PM EDT ----- Regarding: Vit D Vit D low/ Remind her to make sure she is taking her Vit D supplement daily. Thank you!!!  Sarah   ----- Message ----- From: Interface, Lab In Three Zero One Sent: 03/04/2016   9:15 AM To: Eliezer Bottom, NP

## 2016-03-06 ENCOUNTER — Ambulatory Visit: Payer: Medicaid Other | Admitting: Family

## 2016-03-06 ENCOUNTER — Other Ambulatory Visit: Payer: Medicaid Other

## 2016-03-19 ENCOUNTER — Other Ambulatory Visit: Payer: Self-pay | Admitting: Nurse Practitioner

## 2016-03-19 MED ORDER — BENZONATATE 100 MG PO CAPS: 100.0000 mg | ORAL_CAPSULE | Freq: Three times a day (TID) | ORAL | 1 refills | Status: DC | PRN

## 2016-03-19 MED ORDER — PREDNISONE 20 MG PO TABS: ORAL_TABLET | ORAL | 0 refills | Status: DC

## 2016-03-31 ENCOUNTER — Other Ambulatory Visit: Payer: Self-pay | Admitting: *Deleted

## 2016-03-31 DIAGNOSIS — G8929 Other chronic pain: Secondary | ICD-10-CM

## 2016-03-31 DIAGNOSIS — E559 Vitamin D deficiency, unspecified: Secondary | ICD-10-CM

## 2016-03-31 DIAGNOSIS — G4483 Primary cough headache: Secondary | ICD-10-CM

## 2016-03-31 DIAGNOSIS — N644 Mastodynia: Secondary | ICD-10-CM

## 2016-03-31 DIAGNOSIS — J40 Bronchitis, not specified as acute or chronic: Secondary | ICD-10-CM

## 2016-03-31 DIAGNOSIS — R079 Chest pain, unspecified: Secondary | ICD-10-CM

## 2016-03-31 DIAGNOSIS — F411 Generalized anxiety disorder: Secondary | ICD-10-CM

## 2016-03-31 MED ORDER — ALPRAZOLAM 1 MG PO TABS: 1.0000 mg | ORAL_TABLET | Freq: Two times a day (BID) | ORAL | 0 refills | Status: DC | PRN

## 2016-03-31 MED ORDER — OXYCODONE HCL 15 MG PO TABS: 15.0000 mg | ORAL_TABLET | ORAL | 0 refills | Status: DC | PRN

## 2016-04-01 MED FILL — oxyCODONE HCL 15 MG TABS: 15 | 15 days supply | Qty: 90 | Fill #0

## 2016-04-01 MED FILL — ALPRAZolam 1 MG TABS: 1 | 30 days supply | Qty: 60 | Fill #0

## 2016-04-24 ENCOUNTER — Other Ambulatory Visit: Payer: Self-pay | Admitting: *Deleted

## 2016-04-24 DIAGNOSIS — N644 Mastodynia: Secondary | ICD-10-CM

## 2016-04-24 DIAGNOSIS — G4483 Primary cough headache: Secondary | ICD-10-CM

## 2016-04-24 DIAGNOSIS — E559 Vitamin D deficiency, unspecified: Secondary | ICD-10-CM

## 2016-04-24 DIAGNOSIS — R079 Chest pain, unspecified: Secondary | ICD-10-CM

## 2016-04-24 DIAGNOSIS — J40 Bronchitis, not specified as acute or chronic: Secondary | ICD-10-CM

## 2016-04-24 DIAGNOSIS — G8929 Other chronic pain: Secondary | ICD-10-CM

## 2016-04-24 DIAGNOSIS — F411 Generalized anxiety disorder: Secondary | ICD-10-CM

## 2016-04-24 MED ORDER — OXYCODONE HCL 15 MG PO TABS: 15.0000 mg | ORAL_TABLET | ORAL | 0 refills | Status: DC | PRN

## 2016-04-24 MED ORDER — ALPRAZOLAM 1 MG PO TABS: 1.0000 mg | ORAL_TABLET | Freq: Two times a day (BID) | ORAL | 0 refills | Status: DC | PRN

## 2016-04-27 MED FILL — ALPRAZolam 1 MG TABS: 1 | 30 days supply | Qty: 60 | Fill #0

## 2016-04-27 MED FILL — oxyCODONE HCL 15 MG TABS: 15 | 15 days supply | Qty: 90 | Fill #0

## 2016-05-15 DIAGNOSIS — M1711 Unilateral primary osteoarthritis, right knee: Secondary | ICD-10-CM | POA: Insufficient documentation

## 2016-05-19 ENCOUNTER — Other Ambulatory Visit: Payer: Self-pay | Admitting: *Deleted

## 2016-05-19 DIAGNOSIS — J40 Bronchitis, not specified as acute or chronic: Secondary | ICD-10-CM

## 2016-05-19 DIAGNOSIS — G8929 Other chronic pain: Secondary | ICD-10-CM

## 2016-05-19 DIAGNOSIS — G4483 Primary cough headache: Secondary | ICD-10-CM

## 2016-05-19 DIAGNOSIS — F411 Generalized anxiety disorder: Secondary | ICD-10-CM

## 2016-05-19 DIAGNOSIS — R079 Chest pain, unspecified: Secondary | ICD-10-CM

## 2016-05-19 DIAGNOSIS — E559 Vitamin D deficiency, unspecified: Secondary | ICD-10-CM

## 2016-05-19 DIAGNOSIS — N644 Mastodynia: Secondary | ICD-10-CM

## 2016-05-19 MED ORDER — ALPRAZOLAM 1 MG PO TABS: 1.0000 mg | ORAL_TABLET | Freq: Two times a day (BID) | ORAL | 0 refills | Status: DC | PRN

## 2016-05-19 MED ORDER — OXYCODONE HCL 15 MG PO TABS: 15.0000 mg | ORAL_TABLET | ORAL | 0 refills | Status: DC | PRN

## 2016-05-21 MED FILL — oxyCODONE HCL 15 MG TABS: 15 | 15 days supply | Qty: 90 | Fill #0

## 2016-05-21 MED FILL — SUPREP BOWEL PREP KIT: 17.5-3.13-1 | 1 days supply | Qty: 354 | Fill #0

## 2016-06-05 ENCOUNTER — Other Ambulatory Visit: Payer: Self-pay

## 2016-06-05 DIAGNOSIS — G5601 Carpal tunnel syndrome, right upper limb: Secondary | ICD-10-CM | POA: Insufficient documentation

## 2016-06-05 DIAGNOSIS — G5602 Carpal tunnel syndrome, left upper limb: Secondary | ICD-10-CM | POA: Insufficient documentation

## 2016-06-05 DIAGNOSIS — G4483 Primary cough headache: Secondary | ICD-10-CM

## 2016-06-05 DIAGNOSIS — J40 Bronchitis, not specified as acute or chronic: Secondary | ICD-10-CM

## 2016-06-05 DIAGNOSIS — N644 Mastodynia: Secondary | ICD-10-CM

## 2016-06-05 DIAGNOSIS — R079 Chest pain, unspecified: Secondary | ICD-10-CM

## 2016-06-05 DIAGNOSIS — M25561 Pain in right knee: Secondary | ICD-10-CM | POA: Insufficient documentation

## 2016-06-05 DIAGNOSIS — F411 Generalized anxiety disorder: Secondary | ICD-10-CM

## 2016-06-05 DIAGNOSIS — G5621 Lesion of ulnar nerve, right upper limb: Secondary | ICD-10-CM | POA: Insufficient documentation

## 2016-06-05 DIAGNOSIS — G8929 Other chronic pain: Secondary | ICD-10-CM

## 2016-06-05 DIAGNOSIS — E559 Vitamin D deficiency, unspecified: Secondary | ICD-10-CM

## 2016-06-05 DIAGNOSIS — M25361 Other instability, right knee: Secondary | ICD-10-CM | POA: Insufficient documentation

## 2016-06-05 MED ORDER — OXYCODONE HCL 15 MG PO TABS
15.0000 mg | ORAL_TABLET | ORAL | 0 refills | Status: DC | PRN
Start: 2016-06-05 — End: 2016-06-26

## 2016-06-08 MED FILL — oxyCODONE HCL 15 MG TABS: 15 | 15 days supply | Qty: 90 | Fill #0

## 2016-06-17 ENCOUNTER — Telehealth: Payer: Self-pay | Admitting: Hematology & Oncology

## 2016-06-17 NOTE — Telephone Encounter (Signed)
Patient called and cx 06/25/16 and resch for 06/26/16

## 2016-06-25 ENCOUNTER — Other Ambulatory Visit: Payer: Medicaid Other

## 2016-06-25 ENCOUNTER — Ambulatory Visit: Payer: Medicaid Other | Admitting: Family

## 2016-06-26 ENCOUNTER — Ambulatory Visit (HOSPITAL_BASED_OUTPATIENT_CLINIC_OR_DEPARTMENT_OTHER): Payer: Medicaid Other | Admitting: Family

## 2016-06-26 ENCOUNTER — Other Ambulatory Visit (HOSPITAL_BASED_OUTPATIENT_CLINIC_OR_DEPARTMENT_OTHER): Payer: Medicaid Other

## 2016-06-26 ENCOUNTER — Other Ambulatory Visit: Payer: Self-pay

## 2016-06-26 VITALS — BP 135/96 | HR 72 | Temp 98.0°F | Resp 18 | Wt 257.0 lb

## 2016-06-26 DIAGNOSIS — C50912 Malignant neoplasm of unspecified site of left female breast: Secondary | ICD-10-CM

## 2016-06-26 DIAGNOSIS — N644 Mastodynia: Secondary | ICD-10-CM

## 2016-06-26 DIAGNOSIS — Z85528 Personal history of other malignant neoplasm of kidney: Secondary | ICD-10-CM

## 2016-06-26 DIAGNOSIS — F411 Generalized anxiety disorder: Secondary | ICD-10-CM

## 2016-06-26 DIAGNOSIS — R079 Chest pain, unspecified: Secondary | ICD-10-CM

## 2016-06-26 DIAGNOSIS — G4483 Primary cough headache: Secondary | ICD-10-CM

## 2016-06-26 DIAGNOSIS — E559 Vitamin D deficiency, unspecified: Secondary | ICD-10-CM

## 2016-06-26 DIAGNOSIS — J40 Bronchitis, not specified as acute or chronic: Secondary | ICD-10-CM

## 2016-06-26 DIAGNOSIS — Z853 Personal history of malignant neoplasm of breast: Secondary | ICD-10-CM

## 2016-06-26 DIAGNOSIS — C641 Malignant neoplasm of right kidney, except renal pelvis: Secondary | ICD-10-CM

## 2016-06-26 DIAGNOSIS — G8929 Other chronic pain: Secondary | ICD-10-CM

## 2016-06-26 LAB — COMPREHENSIVE METABOLIC PANEL
ALT: 14 U/L (ref 0–55)
AST: 15 U/L (ref 5–34)
Albumin: 3.2 g/dL — ABNORMAL LOW (ref 3.5–5.0)
Alkaline Phosphatase: 101 U/L (ref 40–150)
Anion Gap: 10 mEq/L (ref 3–11)
BUN: 8.3 mg/dL (ref 7.0–26.0)
CO2: 27 mEq/L (ref 22–29)
Calcium: 9.1 mg/dL (ref 8.4–10.4)
Chloride: 103 mEq/L (ref 98–109)
Creatinine: 0.7 mg/dL (ref 0.6–1.1)
EGFR: >90 mL/min/{1.73_m2} (ref >90)
Glucose: 129 mg/dl (ref 70–140)
Potassium: 3.8 mEq/L (ref 3.5–5.1)
Sodium: 139 mEq/L (ref 136–145)
Total Bilirubin: 0.4 mg/dL (ref 0.20–1.20)
Total Protein: 7 g/dL (ref 6.4–8.3)

## 2016-06-26 LAB — CBC WITH DIFFERENTIAL (CANCER CENTER ONLY)
BASO#: 0 10*3/uL (ref 0.0–0.2)
BASO%: 0.4 % (ref 0.0–2.0)
EOS%: 2.8 % (ref 0.0–7.0)
Eosinophils Absolute: 0.2 10*3/uL (ref 0.0–0.5)
HEMATOCRIT: 40.5 % (ref 34.8–46.6)
HEMOGLOBIN: 13.8 g/dL (ref 11.6–15.9)
LYMPH#: 2 10*3/uL (ref 0.9–3.3)
LYMPH%: 25 % (ref 14.0–48.0)
MCH: 31.2 pg (ref 26.0–34.0)
MCHC: 34.1 g/dL (ref 32.0–36.0)
MCV: 92 fL (ref 81–101)
MONO#: 0.5 10*3/uL (ref 0.1–0.9)
MONO%: 6.5 % (ref 0.0–13.0)
NEUT#: 5.2 10*3/uL (ref 1.5–6.5)
NEUT%: 65.3 % (ref 39.6–80.0)
Platelets: 203 10*3/uL (ref 145–400)
RBC: 4.42 10*6/uL (ref 3.70–5.32)
RDW: 13.2 % (ref 11.1–15.7)
WBC: 7.9 10*3/uL (ref 3.9–10.0)

## 2016-06-26 LAB — LACTATE DEHYDROGENASE: LDH: 179 U/L (ref 125–245)

## 2016-06-26 MED ORDER — ALPRAZOLAM 1 MG PO TABS: 1.0000 mg | ORAL_TABLET | Freq: Two times a day (BID) | ORAL | 0 refills | Status: DC | PRN

## 2016-06-26 MED ORDER — OXYCODONE HCL 15 MG PO TABS: 15.0000 mg | ORAL_TABLET | ORAL | 0 refills | Status: DC | PRN

## 2016-06-26 MED ORDER — HYDROCODONE-HOMATROPINE 5-1.5 MG/5ML PO SYRP: 5.0000 mL | ORAL_SOLUTION | Freq: Four times a day (QID) | ORAL | 0 refills | Status: AC | PRN

## 2016-06-26 MED FILL — ALPRAZolam 1 MG TABS: 1 | 30 days supply | Qty: 60 | Fill #0

## 2016-06-26 MED FILL — oxyCODONE HCL 15 MG TABS: 15 | 15 days supply | Qty: 90 | Fill #0

## 2016-06-26 MED FILL — HYDROCODONE-HOMATROPINE SOL: 5-1.5 | 9 days supply | Qty: 180 | Fill #0

## 2016-06-26 NOTE — Progress Notes (Signed)
Hematology and Oncology Follow Up Visit  JAMIERA PHI [id number] 06/17/1966 50 y.o. 06/26/2016   Principle Diagnosis:  1. 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 2. 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 Chronic chest wall pain  Current Therapy:   Observation    Interim History: Ms. Zuloaga is here today for a follow-up. She is doing well and is now living with her ex-sister-in-law which is a much better arrangement for her. She is happy there.  Breast exam today is negative. No mass, lesion rash of lymphadenopathy.   She is having a great deal of pain in her right knee and has failed injections. She is waiting on insurance to approve her having and MRI.  No fever, chills, n/v, rash, dizziness, chest pain, palpitations, abdominal pain or changes in bowel or bladder habits.  Her SOB and cough due to COPD are unchanged. She is still smoking < 1/2 ppd.  No episodes of bleeding or bruising.  No swelling, numbness or tingling in her extremities. No new aches or pains.  Her appetite comes and goes. She is staying hydrated. Her weight is stable.   Medications:    Medication List       Accurate as of 06/26/16 11:56 AM. Always use your most recent med list.          ALPRAZolam 1 MG tablet Commonly known as:  XANAX Take 1 tablet (1 mg total) by mouth 2 (two) times daily as needed for anxiety.   amLODipine 10 MG tablet Commonly known as:  NORVASC Take 1 tablet (10 mg total) by mouth daily before breakfast.   HYDROcodone-homatropine 5-1.5 MG/5ML syrup Commonly known as:  HYCODAN Take 5 mLs by mouth every 6 (six) hours as needed for cough.   lisinopril 10 MG tablet Commonly known as:  PRINIVIL,ZESTRIL Take 1 tablet (10 mg total) by mouth daily before breakfast.   oxyCODONE 15 MG immediate release tablet Commonly known as:  ROXICODONE Take 1 tablet (15 mg total) by mouth every 4 (four) hours as needed.   PREVACID 30 MG capsule Generic  drug:  lansoprazole Take 30 mg by mouth 2 (two) times daily.   Vitamin D3 2000 units Tabs Take 2,000 Units by mouth daily.       Allergies:  Allergies  Allergen Reactions  . Penicillins Anaphylaxis and Hives  . Sulfur Anaphylaxis and Hives  . Darvocet [Propoxyphene N-Acetaminophen]     Nausea, hives, and itching    . Doxycycline Nausea And Vomiting  . Morphine And Related Nausea Only  . Naproxen Nausea And Vomiting  . Nsaids Nausea And Vomiting  . Tape Other (See Comments)    Rips skin  . Toradol [Ketorolac Tromethamine] Nausea And Vomiting  . Ultracet [Tramadol-Acetaminophen] Hives, Itching and Nausea And Vomiting  . Ultram [Tramadol Hcl]     Nausea, hives, itching     Past Medical History, Surgical history, Social history, and Family History were reviewed and updated.  Review of Systems: All other 10 point review of systems is negative.   Physical Exam:  weight is 257 lb (116.6 kg). Her oral temperature is 98 F (36.7 C). Her blood pressure is 135/96 (abnormal) and her pulse is 72. Her respiration is 18.   Wt Readings from Last 3 Encounters:  06/26/16 257 lb (116.6 kg)  03/04/16 251 lb (113.9 kg)  10/21/15 247 lb (112 kg)    Ocular: Sclerae unicteric, pupils equal, round and reactive  to light Ear-nose-throat: Oropharynx clear, dentition fair Lymphatic: No cervical supraclavicular or axillary adenopathy Lungs no rales or rhonchi, good excursion bilaterally Heart regular rate and rhythm, no murmur appreciated Abd soft, nontender, positive bowel sounds, no liver or spleen tip palpated on exam, no fluid wave  MSK no focal spinal tenderness, no joint edema Neuro: non-focal, well-oriented, appropriate affect Breasts: Surgical changes with bilateral mastectomy. No mass, lesion, rash or lymphadenopathy.   Lab Results  Component Value Date   WBC 7.9 06/26/2016   HGB 13.8 06/26/2016   HCT 40.5 06/26/2016   MCV 92 06/26/2016   PLT 203 06/26/2016   No results found  for: FERRITIN, IRON, TIBC, UIBC, IRONPCTSAT Lab Results  Component Value Date   RBC 4.42 06/26/2016   No results found for: KPAFRELGTCHN, LAMBDASER, KAPLAMBRATIO No results found for: IGGSERUM, IGA, IGMSERUM No results found for: Odetta Pink, SPEI   Chemistry      Component Value Date/Time   NA 139 03/04/2016 0857   K 4.4 03/04/2016 0857   CL 102 03/22/2015 0938   CO2 24 03/04/2016 0857   BUN 17.1 03/04/2016 0857   CREATININE 0.7 03/04/2016 0857      Component Value Date/Time   CALCIUM 9.3 03/04/2016 0857   ALKPHOS 118 03/04/2016 0857   AST 19 03/04/2016 0857   ALT 19 03/04/2016 0857   BILITOT 0.43 03/04/2016 0857     Impression and Plan: Ms. Learned is 50 yo white female she is a past history of localized left breast cancer and renal cell carcinoma of the right kidney. She had bilateral mastectomies over 7 years ago. She continues to do well and so far there has been no evidence of recurrence. She has no complaints at this time. Her exam today was negative. CBC, CMP and LDH today are stable. Vit D pending.  Refill prescriptions for xanax, hycodan and oxycodone were given to patient today.  We will plan to see her back in 4 months for labs and follow-up. She knows to contact us with any questions or concerns. We can certainly see her sooner if need be.    Eliezer Bottom, NP 12/8/201711:56 AM

## 2016-06-27 LAB — VITAMIN D 25 HYDROXY (VIT D DEFICIENCY, FRACTURES): Vitamin D, 25-Hydroxy: 12.8 ng/mL — ABNORMAL LOW (ref 30.0–100.0)

## 2016-06-29 ENCOUNTER — Telehealth: Payer: Self-pay | Admitting: *Deleted

## 2016-06-29 ENCOUNTER — Other Ambulatory Visit: Payer: Self-pay | Admitting: Family

## 2016-06-29 DIAGNOSIS — E559 Vitamin D deficiency, unspecified: Secondary | ICD-10-CM

## 2016-06-29 MED ORDER — ERGOCALCIFEROL 1.25 MG (50000 UT) PO CAPS: 50000.0000 [IU] | ORAL_CAPSULE | ORAL | 4 refills | Status: AC

## 2016-06-29 NOTE — Telephone Encounter (Signed)
-----   Message from Eliezer Bottom, NP sent at 06/29/2016  9:32 AM EST ----- Regarding: Vit D Vit D still very low. Supplement changed to 50,000 units once a week. Thank you!  Sarah  ----- Message ----- From: Interface, Lab In Three Zero One Sent: 06/26/2016  11:04 AM To: Eliezer Bottom, NP

## 2016-06-29 NOTE — Progress Notes (Signed)
Vit d low, will change supplement to 50,000 units once a week.

## 2016-07-06 ENCOUNTER — Ambulatory Visit: Payer: Medicaid Other | Admitting: Family

## 2016-07-06 ENCOUNTER — Other Ambulatory Visit: Payer: Medicaid Other

## 2016-07-08 ENCOUNTER — Other Ambulatory Visit: Payer: Self-pay | Admitting: *Deleted

## 2016-07-08 DIAGNOSIS — G4483 Primary cough headache: Secondary | ICD-10-CM

## 2016-07-08 DIAGNOSIS — R079 Chest pain, unspecified: Secondary | ICD-10-CM

## 2016-07-08 DIAGNOSIS — E559 Vitamin D deficiency, unspecified: Secondary | ICD-10-CM

## 2016-07-08 DIAGNOSIS — J40 Bronchitis, not specified as acute or chronic: Secondary | ICD-10-CM

## 2016-07-08 DIAGNOSIS — F411 Generalized anxiety disorder: Secondary | ICD-10-CM

## 2016-07-08 DIAGNOSIS — G8929 Other chronic pain: Secondary | ICD-10-CM

## 2016-07-08 DIAGNOSIS — N644 Mastodynia: Secondary | ICD-10-CM

## 2016-07-08 MED ORDER — OXYCODONE HCL 15 MG PO TABS: 15.0000 mg | ORAL_TABLET | ORAL | 0 refills | Status: DC | PRN

## 2016-07-09 MED FILL — oxyCODONE HCL 15 MG TABS: 15 | 15 days supply | Qty: 90 | Fill #0

## 2016-07-14 DIAGNOSIS — I10 Essential (primary) hypertension: Secondary | ICD-10-CM | POA: Insufficient documentation

## 2016-07-14 DIAGNOSIS — E876 Hypokalemia: Secondary | ICD-10-CM | POA: Insufficient documentation

## 2016-07-14 DIAGNOSIS — G894 Chronic pain syndrome: Secondary | ICD-10-CM | POA: Insufficient documentation

## 2016-07-14 DIAGNOSIS — K529 Noninfective gastroenteritis and colitis, unspecified: Secondary | ICD-10-CM | POA: Insufficient documentation

## 2016-07-24 ENCOUNTER — Other Ambulatory Visit: Payer: Self-pay | Admitting: *Deleted

## 2016-07-24 ENCOUNTER — Other Ambulatory Visit: Payer: Self-pay | Admitting: Family

## 2016-07-24 DIAGNOSIS — S83241A Other tear of medial meniscus, current injury, right knee, initial encounter: Secondary | ICD-10-CM | POA: Insufficient documentation

## 2016-07-27 ENCOUNTER — Encounter: Payer: Self-pay | Admitting: Hematology & Oncology

## 2016-07-27 ENCOUNTER — Ambulatory Visit (HOSPITAL_BASED_OUTPATIENT_CLINIC_OR_DEPARTMENT_OTHER): Payer: Medicaid Other | Admitting: Hematology & Oncology

## 2016-07-27 ENCOUNTER — Other Ambulatory Visit (HOSPITAL_BASED_OUTPATIENT_CLINIC_OR_DEPARTMENT_OTHER): Payer: Medicaid Other

## 2016-07-27 ENCOUNTER — Other Ambulatory Visit: Payer: Self-pay | Admitting: *Deleted

## 2016-07-27 ENCOUNTER — Ambulatory Visit (HOSPITAL_BASED_OUTPATIENT_CLINIC_OR_DEPARTMENT_OTHER): Payer: Medicaid Other

## 2016-07-27 VITALS — Ht 63.0 in | Wt 247.0 lb

## 2016-07-27 DIAGNOSIS — C50912 Malignant neoplasm of unspecified site of left female breast: Secondary | ICD-10-CM | POA: Diagnosis not present

## 2016-07-27 DIAGNOSIS — F411 Generalized anxiety disorder: Secondary | ICD-10-CM

## 2016-07-27 DIAGNOSIS — J40 Bronchitis, not specified as acute or chronic: Secondary | ICD-10-CM

## 2016-07-27 DIAGNOSIS — R079 Chest pain, unspecified: Secondary | ICD-10-CM

## 2016-07-27 DIAGNOSIS — G4483 Primary cough headache: Secondary | ICD-10-CM

## 2016-07-27 DIAGNOSIS — E559 Vitamin D deficiency, unspecified: Secondary | ICD-10-CM

## 2016-07-27 DIAGNOSIS — J209 Acute bronchitis, unspecified: Secondary | ICD-10-CM

## 2016-07-27 DIAGNOSIS — C641 Malignant neoplasm of right kidney, except renal pelvis: Secondary | ICD-10-CM

## 2016-07-27 DIAGNOSIS — J189 Pneumonia, unspecified organism: Secondary | ICD-10-CM

## 2016-07-27 DIAGNOSIS — G8929 Other chronic pain: Secondary | ICD-10-CM

## 2016-07-27 DIAGNOSIS — N644 Mastodynia: Secondary | ICD-10-CM

## 2016-07-27 LAB — COMPREHENSIVE METABOLIC PANEL
ALBUMIN: 3.4 g/dL — AB (ref 3.5–5.0)
ALK PHOS: 94 U/L (ref 40–150)
ALT: 21 U/L (ref 0–55)
AST: 18 U/L (ref 5–34)
Anion Gap: 10 mEq/L (ref 3–11)
BILIRUBIN TOTAL: 0.62 mg/dL (ref 0.20–1.20)
BUN: 7.5 mg/dL (ref 7.0–26.0)
CALCIUM: 9.7 mg/dL (ref 8.4–10.4)
CO2: 27 mEq/L (ref 22–29)
Chloride: 103 mEq/L (ref 98–109)
Creatinine: 0.7 mg/dL (ref 0.6–1.1)
Glucose: 126 mg/dl (ref 70–140)
Potassium: 3.6 mEq/L (ref 3.5–5.1)
SODIUM: 140 meq/L (ref 136–145)
TOTAL PROTEIN: 7.6 g/dL (ref 6.4–8.3)

## 2016-07-27 LAB — CBC WITH DIFFERENTIAL (CANCER CENTER ONLY)
BASO#: 0 10*3/uL (ref 0.0–0.2)
BASO%: 0.2 % (ref 0.0–2.0)
EOS ABS: 0.1 10*3/uL (ref 0.0–0.5)
EOS%: 0.7 % (ref 0.0–7.0)
HEMATOCRIT: 41.8 % (ref 34.8–46.6)
HEMOGLOBIN: 14 g/dL (ref 11.6–15.9)
LYMPH#: 1.4 10*3/uL (ref 0.9–3.3)
LYMPH%: 15.4 % (ref 14.0–48.0)
MCH: 30.4 pg (ref 26.0–34.0)
MCHC: 33.5 g/dL (ref 32.0–36.0)
MCV: 91 fL (ref 81–101)
MONO#: 0.7 10*3/uL (ref 0.1–0.9)
MONO%: 8 % (ref 0.0–13.0)
NEUT%: 75.7 % (ref 39.6–80.0)
NEUTROS ABS: 6.9 10*3/uL — AB (ref 1.5–6.5)
Platelets: 335 10*3/uL (ref 145–400)
RBC: 4.61 10*6/uL (ref 3.70–5.32)
RDW: 14 % (ref 11.1–15.7)
WBC: 9.1 10*3/uL (ref 3.9–10.0)

## 2016-07-27 LAB — LACTATE DEHYDROGENASE: LDH: 250 U/L — AB (ref 125–245)

## 2016-07-27 MED ORDER — OXYCODONE HCL 15 MG PO TABS: 15.0000 mg | ORAL_TABLET | ORAL | 0 refills | Status: DC | PRN

## 2016-07-27 MED ORDER — IPRATROPIUM-ALBUTEROL 0.5-2.5 (3) MG/3ML IN SOLN
3.0000 mL | Freq: Four times a day (QID) | RESPIRATORY_TRACT | Status: DC
  Administered 2016-07-27: 3 mL via RESPIRATORY_TRACT

## 2016-07-27 MED ORDER — MOXIFLOXACIN HCL 400 MG PO TABS: 400.0000 mg | ORAL_TABLET | Freq: Every day | ORAL | 0 refills | Status: AC

## 2016-07-27 MED ORDER — ALPRAZOLAM 1 MG PO TABS: 1.0000 mg | ORAL_TABLET | Freq: Two times a day (BID) | ORAL | 0 refills | Status: DC | PRN

## 2016-07-27 MED ORDER — IPRATROPIUM-ALBUTEROL 0.5-2.5 (3) MG/3ML IN SOLN
RESPIRATORY_TRACT | Status: AC
  Filled 2016-07-27: qty 3

## 2016-07-27 MED ORDER — LEVOFLOXACIN IN D5W 500 MG/100ML IV SOLN
500.0000 mg | INTRAVENOUS | Status: DC
  Administered 2016-07-27: 500 mg via INTRAVENOUS
  Filled 2016-07-27: qty 100

## 2016-07-27 MED FILL — oxyCODONE HCL 15 MG TABS: 15 | 15 days supply | Qty: 90 | Fill #0

## 2016-07-27 MED FILL — ALPRAZolam 1 MG TABS: 1 | 30 days supply | Qty: 60 | Fill #0

## 2016-07-27 NOTE — Progress Notes (Signed)
Hematology and Oncology Follow Up Visit  [name] [id number] 1965/08/18 51 y.o. 07/27/2016   Principle Diagnosis:  1. 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 2. 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 Chronic chest wall pain  Current Therapy:   Observation    Interim History: [name] is here today for a follow-up. She now is living back in [place]. She had been up in [place]. She is moving back to [place].  She has had problems with bronchitis/pneumonia. She has been to the emergency room several times. She says she was hospitalized for a couple days. This was at [facility].  She is having a lot of knee issues. She has seen orthopedic surgery. It sounds like she will need knee surgery. I don't have a problem with her having this.   She is still smoking. Hopefully, with his bronchitis/pneumonia, she will stop smoking.   She is not having any problems with bowels or bladder. She's had no rashes. She's had no fever.   There's been no bleeding.   Overall, her performance status is ECOG 1-2.   Medications:  Allergies as of 07/27/2016      Reactions   Aspirin Shortness Of Breath   Cefaclor Swelling   Penicillins Anaphylaxis, Hives   Sulfur Anaphylaxis, Hives   Tolmetin Nausea And Vomiting   Darvocet [propoxyphene N-acetaminophen]    Nausea, hives, and itching    Morphine And Related Nausea Only   Naproxen Nausea And Vomiting   Nsaids Nausea And Vomiting   Tape Other (See Comments)   Rips skin   Toradol [ketorolac Tromethamine] Nausea And Vomiting   Ultracet [tramadol-acetaminophen] Hives, Itching, Nausea And Vomiting   Ultram [tramadol Hcl]    Nausea, hives, itching    Doxycycline Nausea And Vomiting   Meperidine Itching   Propoxyphene Nausea And Vomiting      Medication List       Accurate as of 07/27/16 12:47 PM. Always use your most recent med list.          ADVAIR DISKUS 250-50 MCG/DOSE  Aepb Generic drug:  Fluticasone-Salmeterol Inhale one puff into the lungs 2 (two) times daily.   ALPRAZolam 1 MG tablet Commonly known as:  XANAX Take 1 tablet (1 mg total) by mouth 2 (two) times daily as needed for anxiety.   amLODipine 10 MG tablet Commonly known as:  NORVASC Take by mouth.   amLODipine 10 MG tablet Commonly known as:  NORVASC Take 1 tablet (10 mg total) by mouth daily before breakfast.   azithromycin 250 MG tablet Commonly known as:  ZITHROMAX Take by mouth.   benzonatate 100 MG capsule Commonly known as:  TESSALON Take by mouth.   colestipol 1 g tablet Commonly known as:  COLESTID Take 1 gram by mouth twice a day   docusate sodium 100 MG capsule Commonly known as:  COLACE Take 100 mg by mouth.   ergocalciferol 50000 units capsule Commonly known as:  VITAMIN D2 Take 1 capsule (50,000 Units total) by mouth once a week.   HYDROcodone-homatropine 5-1.5 MG/5ML syrup Commonly known as:  HYCODAN Take 5 mLs by mouth every 6 (six) hours as needed for cough.   lisinopril 20 MG tablet Commonly known as:  PRINIVIL,ZESTRIL Take by mouth.   lisinopril 10 MG tablet Commonly known as:  PRINIVIL,ZESTRIL Take 1 tablet (10 mg total) by mouth daily before breakfast.   moxifloxacin 400 MG tablet Commonly  known as:  AVELOX Take 1 tablet (400 mg total) by mouth daily at 8 pm.   oxyCODONE 15 MG immediate release tablet Commonly known as:  ROXICODONE Take 1 tablet (15 mg total) by mouth every 4 (four) hours as needed.   predniSONE 20 MG tablet Commonly known as:  DELTASONE Take by mouth.   PREVACID 30 MG capsule Generic drug:  lansoprazole Take 30 mg by mouth 2 (two) times daily.   PROAIR HFA 108 (90 Base) MCG/ACT inhaler Generic drug:  albuterol inhale 2 puffs into the lungs EVERY 4-6 HOUR AS NEEDED for WHEEZING (200/12=17)   Vitamin D 2000 units tablet Take by mouth.       Allergies:  Allergies  Allergen Reactions  . Aspirin Shortness Of  Breath  . Cefaclor Swelling  . Penicillins Anaphylaxis and Hives  . Sulfur Anaphylaxis and Hives  . Tolmetin Nausea And Vomiting  . Darvocet [Propoxyphene N-Acetaminophen]     Nausea, hives, and itching    . Morphine And Related Nausea Only  . Naproxen Nausea And Vomiting  . Nsaids Nausea And Vomiting  . Tape Other (See Comments)    Rips skin  . Toradol [Ketorolac Tromethamine] Nausea And Vomiting  . Ultracet [Tramadol-Acetaminophen] Hives, Itching and Nausea And Vomiting  . Ultram [Tramadol Hcl]     Nausea, hives, itching   . Doxycycline Nausea And Vomiting  . Meperidine Itching  . Propoxyphene Nausea And Vomiting    Past Medical History, Surgical history, Social history, and Family History were reviewed and updated.  Review of Systems: All other 10 point review of systems is negative.   Physical Exam:  height is 5\' 3"  (1.6 m) and weight is 247 lb (112 kg).   Wt Readings from Last 3 Encounters:  07/27/16 247 lb (112 kg)  06/26/16 257 lb (116.6 kg)  03/04/16 251 lb (113.9 kg)    Ocular: Sclerae unicteric, pupils equal, round and reactive to light Ear-nose-throat: Oropharynx clear, dentition fair Lymphatic: No cervical supraclavicular or axillary adenopathy Lungs no rales or rhonchi, good excursion bilaterally Heart regular rate and rhythm, no murmur appreciated Abd soft, nontender, positive bowel sounds, no liver or spleen tip palpated on exam, no fluid wave  MSK no focal spinal tenderness, no joint edema Neuro: non-focal, well-oriented, appropriate affect Breasts: Surgical changes with bilateral mastectomy. No mass, lesion, rash or lymphadenopathy.   Lab Results  Component Value Date   WBC 9.1 07/27/2016   HGB 14.0 07/27/2016   HCT 41.8 07/27/2016   MCV 91 07/27/2016   PLT 335 07/27/2016   No results found for: FERRITIN, IRON, TIBC, UIBC, IRONPCTSAT Lab Results  Component Value Date   RBC 4.61 07/27/2016   No results found for: KPAFRELGTCHN, LAMBDASER,  KAPLAMBRATIO No results found for: IGGSERUM, IGA, IGMSERUM No results found for: Odetta Pink, SPEI   Chemistry      Component Value Date/Time   NA 140 07/27/2016 0812   K 3.6 07/27/2016 0812   CL 102 03/22/2015 0938   CO2 27 07/27/2016 0812   BUN 7.5 07/27/2016 0812   CREATININE 0.7 07/27/2016 0812      Component Value Date/Time   CALCIUM 9.7 07/27/2016 0812   ALKPHOS 94 07/27/2016 0812   AST 18 07/27/2016 0812   ALT 21 07/27/2016 0812   BILITOT 0.62 07/27/2016 0812     Impression and Plan: [name] is 52 yo white female she is a past history of localized left breast cancer and renal  cell carcinoma of the right kidney. She had bilateral mastectomies over 7 years ago. She continues to do well and so far there has been no evidence of recurrence.   Problem right now clearly is this bronchitis/pneumonia. I think a lot of this has to do with her smoking. I still think she probably is smoking a little too much. We have talked to her about this.  I will go ahead and give her some IV Levaquin today. She has a lot of drug allergies. She is not allergic to quinolones. I will then put her on some Avelox for about 7 days.   It sounds like she is going to need knee surgery. I don't see any problems with her having knee surgery. Malignancy should never be a problem as I do not think that breast cancer will ever come back on her. She has had a history of renal cell carcinoma. Again I do not think this will be an issue.   Refill prescriptions for xanax, and oxycodone were given to patient today.   We will plan to see her back in 4 months for labs and follow-up.  She knows to contact us with any questions or concerns. We can certainly see her sooner if need be.    [name], MD 1/8/201812:47 PM

## 2016-07-28 LAB — VITAMIN D 25 HYDROXY (VIT D DEFICIENCY, FRACTURES): VIT D 25 HYDROXY: 16.1 ng/mL — AB (ref 30.0–100.0)

## 2016-08-07 ENCOUNTER — Other Ambulatory Visit: Payer: Self-pay | Admitting: *Deleted

## 2016-08-07 DIAGNOSIS — J40 Bronchitis, not specified as acute or chronic: Secondary | ICD-10-CM

## 2016-08-07 DIAGNOSIS — R079 Chest pain, unspecified: Secondary | ICD-10-CM

## 2016-08-07 DIAGNOSIS — N644 Mastodynia: Secondary | ICD-10-CM

## 2016-08-07 DIAGNOSIS — G4483 Primary cough headache: Secondary | ICD-10-CM

## 2016-08-07 DIAGNOSIS — E559 Vitamin D deficiency, unspecified: Secondary | ICD-10-CM

## 2016-08-07 DIAGNOSIS — G8929 Other chronic pain: Secondary | ICD-10-CM

## 2016-08-07 DIAGNOSIS — F411 Generalized anxiety disorder: Secondary | ICD-10-CM

## 2016-08-10 ENCOUNTER — Other Ambulatory Visit: Payer: Self-pay | Admitting: *Deleted

## 2016-08-10 DIAGNOSIS — N644 Mastodynia: Secondary | ICD-10-CM

## 2016-08-10 DIAGNOSIS — G8929 Other chronic pain: Secondary | ICD-10-CM

## 2016-08-10 DIAGNOSIS — G4483 Primary cough headache: Secondary | ICD-10-CM

## 2016-08-10 DIAGNOSIS — R079 Chest pain, unspecified: Secondary | ICD-10-CM

## 2016-08-10 DIAGNOSIS — F411 Generalized anxiety disorder: Secondary | ICD-10-CM

## 2016-08-10 DIAGNOSIS — E559 Vitamin D deficiency, unspecified: Secondary | ICD-10-CM

## 2016-08-10 DIAGNOSIS — J40 Bronchitis, not specified as acute or chronic: Secondary | ICD-10-CM

## 2016-08-10 MED ORDER — OXYCODONE HCL 15 MG PO TABS: 15.0000 mg | ORAL_TABLET | ORAL | 0 refills | Status: DC | PRN

## 2016-08-10 MED FILL — oxyCODONE HCL 15 MG TABS: 15 | 15 days supply | Qty: 90 | Fill #0

## 2016-08-26 ENCOUNTER — Other Ambulatory Visit: Payer: Self-pay | Admitting: *Deleted

## 2016-08-26 DIAGNOSIS — N644 Mastodynia: Secondary | ICD-10-CM

## 2016-08-26 DIAGNOSIS — J40 Bronchitis, not specified as acute or chronic: Secondary | ICD-10-CM

## 2016-08-26 DIAGNOSIS — G4483 Primary cough headache: Secondary | ICD-10-CM

## 2016-08-26 DIAGNOSIS — R079 Chest pain, unspecified: Secondary | ICD-10-CM

## 2016-08-26 DIAGNOSIS — E559 Vitamin D deficiency, unspecified: Secondary | ICD-10-CM

## 2016-08-26 DIAGNOSIS — F411 Generalized anxiety disorder: Secondary | ICD-10-CM

## 2016-08-26 DIAGNOSIS — G8929 Other chronic pain: Secondary | ICD-10-CM

## 2016-08-26 MED ORDER — OXYCODONE HCL 15 MG PO TABS: 15.0000 mg | ORAL_TABLET | ORAL | 0 refills | Status: DC | PRN

## 2016-08-26 MED ORDER — ALPRAZOLAM 1 MG PO TABS: 1.0000 mg | ORAL_TABLET | Freq: Two times a day (BID) | ORAL | 0 refills | Status: DC | PRN

## 2016-08-27 MED FILL — oxyCODONE HCL 15 MG TABS: 15 | 15 days supply | Qty: 90 | Fill #0

## 2016-08-27 MED FILL — ALPRAZolam 1 MG TABS: 1 | 30 days supply | Qty: 60 | Fill #0

## 2016-09-08 ENCOUNTER — Other Ambulatory Visit: Payer: Self-pay | Admitting: *Deleted

## 2016-09-08 DIAGNOSIS — G4483 Primary cough headache: Secondary | ICD-10-CM

## 2016-09-08 DIAGNOSIS — J40 Bronchitis, not specified as acute or chronic: Secondary | ICD-10-CM

## 2016-09-08 DIAGNOSIS — G8929 Other chronic pain: Secondary | ICD-10-CM

## 2016-09-08 DIAGNOSIS — N644 Mastodynia: Secondary | ICD-10-CM

## 2016-09-08 DIAGNOSIS — F411 Generalized anxiety disorder: Secondary | ICD-10-CM

## 2016-09-08 DIAGNOSIS — R079 Chest pain, unspecified: Secondary | ICD-10-CM

## 2016-09-08 DIAGNOSIS — E559 Vitamin D deficiency, unspecified: Secondary | ICD-10-CM

## 2016-09-08 MED ORDER — OXYCODONE HCL 15 MG PO TABS: 15.0000 mg | ORAL_TABLET | ORAL | 0 refills | Status: DC | PRN

## 2016-09-11 MED FILL — oxyCODONE HCL 15 MG TABS: 15 | 15 days supply | Qty: 90 | Fill #0

## 2016-09-23 ENCOUNTER — Other Ambulatory Visit: Payer: Self-pay | Admitting: *Deleted

## 2016-09-23 DIAGNOSIS — E559 Vitamin D deficiency, unspecified: Secondary | ICD-10-CM

## 2016-09-23 DIAGNOSIS — R079 Chest pain, unspecified: Secondary | ICD-10-CM

## 2016-09-23 DIAGNOSIS — N644 Mastodynia: Secondary | ICD-10-CM

## 2016-09-23 DIAGNOSIS — G8929 Other chronic pain: Secondary | ICD-10-CM

## 2016-09-23 DIAGNOSIS — G4483 Primary cough headache: Secondary | ICD-10-CM

## 2016-09-23 DIAGNOSIS — J40 Bronchitis, not specified as acute or chronic: Secondary | ICD-10-CM

## 2016-09-23 DIAGNOSIS — F411 Generalized anxiety disorder: Secondary | ICD-10-CM

## 2016-09-23 MED ORDER — ALPRAZOLAM 1 MG PO TABS: 1.0000 mg | ORAL_TABLET | Freq: Two times a day (BID) | ORAL | 0 refills | Status: AC | PRN

## 2016-09-23 MED ORDER — OXYCODONE HCL 15 MG PO TABS: 15.0000 mg | ORAL_TABLET | ORAL | 0 refills | Status: AC | PRN

## 2016-09-25 MED FILL — oxyCODONE HCL 15 MG TABS: 15 | 15 days supply | Qty: 90 | Fill #0

## 2016-09-25 MED FILL — ALPRAZolam 1 MG TABS: 1 | 30 days supply | Qty: 60 | Fill #0

## 2016-10-18 DEATH — deceased

## 2016-10-29 ENCOUNTER — Ambulatory Visit: Payer: Self-pay | Admitting: Hematology & Oncology

## 2016-10-29 ENCOUNTER — Other Ambulatory Visit: Payer: Self-pay
